# Patient Record
Sex: Female | Born: 1935 | Race: Black or African American | Hispanic: No | Marital: Single | Smoking: Never smoker
Health system: Southern US, Community
[De-identification: ages and names within clinical notes are randomized; demographics above are authoritative.]

## PROBLEM LIST (undated history)

## (undated) DIAGNOSIS — F0390 Unspecified dementia without behavioral disturbance: Secondary | ICD-10-CM

## (undated) DIAGNOSIS — Z960 Presence of urogenital implants: Secondary | ICD-10-CM

## (undated) DIAGNOSIS — E119 Type 2 diabetes mellitus without complications: Secondary | ICD-10-CM

## (undated) DIAGNOSIS — R413 Other amnesia: Secondary | ICD-10-CM

## (undated) DIAGNOSIS — K08109 Complete loss of teeth, unspecified cause, unspecified class: Secondary | ICD-10-CM

## (undated) DIAGNOSIS — E785 Hyperlipidemia, unspecified: Secondary | ICD-10-CM

## (undated) DIAGNOSIS — M5136 Other intervertebral disc degeneration, lumbar region: Secondary | ICD-10-CM

## (undated) DIAGNOSIS — M8580 Other specified disorders of bone density and structure, unspecified site: Secondary | ICD-10-CM

## (undated) DIAGNOSIS — I10 Essential (primary) hypertension: Secondary | ICD-10-CM

## (undated) DIAGNOSIS — Z972 Presence of dental prosthetic device (complete) (partial): Secondary | ICD-10-CM

## (undated) DIAGNOSIS — M51369 Other intervertebral disc degeneration, lumbar region without mention of lumbar back pain or lower extremity pain: Secondary | ICD-10-CM

## (undated) DIAGNOSIS — K5730 Diverticulosis of large intestine without perforation or abscess without bleeding: Secondary | ICD-10-CM

## (undated) DIAGNOSIS — E538 Deficiency of other specified B group vitamins: Secondary | ICD-10-CM

## (undated) DIAGNOSIS — N200 Calculus of kidney: Secondary | ICD-10-CM

## (undated) HISTORY — DX: Deficiency of other specified B group vitamins: E53.8

## (undated) HISTORY — DX: Other specified disorders of bone density and structure, unspecified site: M85.80

## (undated) HISTORY — DX: Other intervertebral disc degeneration, lumbar region: M51.36

## (undated) HISTORY — PX: OVARIAN CYST SURGERY: SHX726

## (undated) HISTORY — DX: Unspecified dementia, unspecified severity, without behavioral disturbance, psychotic disturbance, mood disturbance, and anxiety: F03.90

## (undated) HISTORY — PX: TUBAL LIGATION: SHX77

## (undated) HISTORY — DX: Other intervertebral disc degeneration, lumbar region without mention of lumbar back pain or lower extremity pain: M51.369

---

## 1960-03-22 HISTORY — PX: APPENDECTOMY: SHX54

## 1968-03-22 HISTORY — PX: TONSILLECTOMY: SUR1361

## 1998-01-09 ENCOUNTER — Emergency Department (HOSPITAL_COMMUNITY): Admission: EM | Admit: 1998-01-09 | Discharge: 1998-01-10 | Payer: Self-pay

## 1998-03-22 HISTORY — PX: CATARACT EXTRACTION W/ INTRAOCULAR LENS  IMPLANT, BILATERAL: SHX1307

## 1999-05-29 ENCOUNTER — Encounter: Payer: Self-pay | Admitting: Internal Medicine

## 1999-05-29 ENCOUNTER — Encounter: Payer: Self-pay | Admitting: Emergency Medicine

## 1999-05-29 ENCOUNTER — Inpatient Hospital Stay (HOSPITAL_COMMUNITY): Admission: EM | Admit: 1999-05-29 | Discharge: 1999-05-29 | Payer: Self-pay | Admitting: Emergency Medicine

## 1999-06-09 ENCOUNTER — Encounter: Payer: Self-pay | Admitting: Urology

## 1999-06-09 ENCOUNTER — Inpatient Hospital Stay (HOSPITAL_COMMUNITY): Admission: EM | Admit: 1999-06-09 | Discharge: 1999-06-12 | Payer: Self-pay | Admitting: Emergency Medicine

## 1999-06-09 ENCOUNTER — Encounter: Payer: Self-pay | Admitting: Emergency Medicine

## 1999-06-10 ENCOUNTER — Encounter: Payer: Self-pay | Admitting: Urology

## 1999-06-10 HISTORY — PX: OTHER SURGICAL HISTORY: SHX169

## 1999-09-16 ENCOUNTER — Other Ambulatory Visit: Admission: RE | Admit: 1999-09-16 | Discharge: 1999-09-16 | Payer: Self-pay | Admitting: *Deleted

## 1999-11-02 ENCOUNTER — Encounter (INDEPENDENT_AMBULATORY_CARE_PROVIDER_SITE_OTHER): Payer: Self-pay | Admitting: Specialist

## 1999-11-02 ENCOUNTER — Inpatient Hospital Stay (HOSPITAL_COMMUNITY): Admission: RE | Admit: 1999-11-02 | Discharge: 1999-11-06 | Payer: Self-pay | Admitting: Obstetrics & Gynecology

## 1999-11-02 HISTORY — PX: OTHER SURGICAL HISTORY: SHX169

## 2002-03-22 HISTORY — PX: CHOLECYSTECTOMY: SHX55

## 2002-03-24 ENCOUNTER — Encounter: Payer: Self-pay | Admitting: Emergency Medicine

## 2002-03-24 ENCOUNTER — Emergency Department (HOSPITAL_COMMUNITY): Admission: EM | Admit: 2002-03-24 | Discharge: 2002-03-24 | Payer: Self-pay | Admitting: Emergency Medicine

## 2002-09-17 ENCOUNTER — Encounter: Payer: Self-pay | Admitting: Internal Medicine

## 2002-09-17 ENCOUNTER — Ambulatory Visit (HOSPITAL_COMMUNITY): Admission: RE | Admit: 2002-09-17 | Discharge: 2002-09-17 | Payer: Self-pay | Admitting: Internal Medicine

## 2004-01-03 ENCOUNTER — Emergency Department (HOSPITAL_COMMUNITY): Admission: EM | Admit: 2004-01-03 | Discharge: 2004-01-03 | Payer: Self-pay | Admitting: Emergency Medicine

## 2008-01-01 ENCOUNTER — Emergency Department (HOSPITAL_COMMUNITY): Admission: EM | Admit: 2008-01-01 | Discharge: 2008-01-01 | Payer: Self-pay | Admitting: Emergency Medicine

## 2010-08-07 NOTE — Op Note (Signed)
[facility]  Patient:    [name], [name]                MRN: [id number] Proc. Date: 11/02/99 Adm. Date:  [id number] Attending:  Genia Del                           Operative Report  PREOPERATIVE DIAGNOSIS:  Symptomatic complete uterine prolapses with cystocele grade 4/4 complicated by secondary left ureter dilatation and hydronephrosis (left stent in place) and vaginal polyps.  POSTOPERATIVE DIAGNOSIS:  Symptomatic complete uterine prolpases with cystocele grade 4/4 complicated by secondary left ureter dilatation and hydronephrosis (left stent in place) and vaginal polyps, status post left salpingo-oophorectomy and status post appendectomy.  OPERATION PERFORMED:  Total abdominal hysterectomy, right salpingo-oophorectomy plus Moschcowitzs procedure, colpocystopexy and Burch procedure post vaginal polypectomy.  SURGEON:  Genia Del, M.D.  ASSISTANT:  Pershing Cox, M.D.  ANESTHESIA:  General.  DESCRIPTION OF PROCEDURE:  Under general anesthesia with endotracheal intubation, the patient in lithotomy position, she is prepped with Betadine in the abdomen, suprapubic, vulva and vaginal areas, and draped as usual.  A bladder catheter is put in place.  The gynecologic exam under general anesthesia revealed a complete uterine prolapse with cystocele 4/4 and vaginal polyps.  No adnexal mass is palpable.  The uterine volume is normal.  A median incision is done with the scarlpel.  Excision of the old sca is accomplished. We opened in the midline the aponeurosis and then opened longitudinally the peritoneum with the Metzenbaum scissors.  Exploration of the abdominal cavity revealed a normal liver and normal kidneys, normal bowel.  No appendix.  The pelvic exam reveals a small uterus with normal right ovary and tube, absence of left ovary and tube.  Both sides of the uterus are grasped with Kelly clamps.  We then ligated the round  ligaments on each side with 0 Vicryl.  We cut with the electrocautery.  We then opened the anterior visceral peritoneum and then retracted the bladder downward.  The ureters were well identified on both sides.  The stent is palpable on the left side.  They are away from the surgical field in normal anatomic position.  We then opened a window in the broad ligament on the right side, clamped the infundibulopelvic ligament with the Heaney clamps.  We cut with Mayos and sutured with a free tie and a stitch of 0 Vicryl.  We then preceded along each site of the uterus, skeletonizing the uterine arteries with Metzenbaum scissors.  We then clamped with Heaney clamps on each side, grasped with Mayos and sutured with 0 Vicryl.  We doubled that suture on the uterine arteries on each side.  We then went along the uterus, clamping the cardinal ligaments with the straight Heaney, cutting with the scalpel and suturing with 0 Vicryl with a Heaney suture on each side.  We then reached the angle of the vagina.  We used a curved Heaney on each side.  We clamped and cut with Mayo scissors and sutured with a Heaney suture with 0 Vicryl on each side.  We then preceded with the hysterectomy by cutting the vaginal cuff along the cervix with Mayo scissors. The uterus is given in one piece with the cervix and the right adnexa.  It is sent to pathology.  We then finished the closure of the vaginal vault with 0 Vicryl suture.  Hemostasis is good.  We irrigated the vaginal mucosa  anteriorly and posteriorly to allow placement of the Mersilene mesh. Hemostasis was verified also at the level of the round ligaments and on the right infundibulopelvic ligament, and is adequate.  We therefore preceded with placement of the Mersilene mesh on the vaginal vault.  It is secured with Ethibond 0 anteriorly with three sutures and posteriorly with three sutures. We then opened the peritoneum over the left aspect of the sacrum.   The promontory is identified and at the level of S2 and S3, the sacrum is cleaned as much as possible to the bone.  A very mild venous bleeder is present at that level which is then controlled by compression.  A suture with Ethibond 0 is placed and S2 and then S3 on the perineum.  We then evaluated the size of Mersilene mesh needed by putting a second glove on the right hand and evaluating the tension on the vagina.  The mesh is cut at the level where good suspension of the vagina is reached and yet no tension is present.  We then fixed the two sacral sutures to the Mersilene mesh and attached them.  We then applied compression and reached excellent hemostasis.  Vaginal polypectomies x 2 are done by Dr. Carey Bullocks.  We then proceeded with the Burch procedure opening the vitreous space and reaching the fascia on each side of the urethra.  We pulled on the bladder catheter to make sure that the balloon is at the level of the neck of the bladder.  We inserted a medial suture first on the right side taking the fascia and then attaching it to Coopers ligament ipsilaterally.  A second suture more lateral is then placed and also attached to the ipsilateral Coopers ligament.  We proceeded exactly the same on the left side where hemostasis is completed by compression and Gelfoam.  We then removed all the abdominal laps and the retractor that was put in place at the beginning of the surgery.  We then closed the median incision by two half running sutured of absorbable 0.  We then completed hemostasis with electrocautery at the adipose tissue and closed the skin with staples.  A dry dressing is then applied after infiltrating the subcutaneous tissue with 0.50% Marcaine 10 cc.  The estimated blood loss was 200 cc.  Urine was clear.  No complications occurred and the patient was transferred to the recovery room in good condition.  The instrument counts and compresses were exact and complete x 2. DD:   11/02/99 TD:  11/03/99 Job: [id number]

## 2010-08-07 NOTE — Discharge Summary (Signed)
[place]  Patient:    [name], [name]                        MRN: [id number] Adm. Date:  [id number] Disc. Date: [id number] Attending:  Nelma Rothman Iii                           Discharge Summary  DIAGNOSIS: Left chronic hydronephrosis with urinary tract infection and pain.  OPERATION/PROCEDURE:  1. Cystoscopy on June 10, 1999.  2. Left retrograde ureteral pyelogram and left stent placed on June 10, 1999.  HISTORY OF PRESENT ILLNESS: Ms. [name] is a 77 year old black female who presented with left flank pain and nausea and vomiting.  She had known left hydronephrosis and diabetes which has progressively increased since CAT scan on May 29, 1999.  She was admitted via the emergency room for pain control and stent.  PAST MEDICAL HISTORY, SOCIAL HISTORY, FAMILY HISTORY, REVIEW OF SYSTEMS: Please see the History and Physical for full details.  PHYSICAL EXAMINATION:  VITAL SIGNS: Temperature 99.1 degrees Fahrenheit, pulse 72, respirations 20, blood pressure 148/80.  GENERAL: She is well-developed, well-nourished and in mild distress, oriented x 3.  HEENT: Normal.  NECK: Without masses or thyromegaly.  CHEST: Clear anterior and posterior without rales or rhonchi.  BREAST: Normal, no adenopathy present.  HEART: Normal sinus rhythm without murmurs, rubs, or gallops.  ABDOMEN: Mild left upper quadrant and left CVA tenderness noted to palpation but no masses noted.  GU: Otherwise normal.  EXTREMITIES: Normal.  SKIN: Intact.  HOSPITAL COURSE: The patient was admitted and placed on appropriate IV antibiotics along with sliding-scale insulin, consisting of Cipro IV, and was subsequently taken to surgery on June 10, 1999 by Dr. Larey Dresser.  She underwent a left retrograde ureteral pyelogram with left JJ stent placed.  She was noted to have what was thought to be a cystocele causing hydronephrosis. By June 11, 1999 she was  afebrile and CBGs were okay.  The morphine PCA was discontinued and diet was advanced.  She subsequently did well and was discharged on June 12, 1999 feeling much better.  She was discharged home with the stent.  DISCHARGE MEDICATIONS: Cipro.  FOLLOW-UP: She was to follow up with Dr. Vernie Ammons the following week. Discharge instructions were given.  DISCHARGE CONDITION: Improved.DD:  07/21/99 TD:  07/23/99 Job: [id number]

## 2010-08-07 NOTE — Discharge Summary (Signed)
[place]  Patient:    [name], [name]                        MRN: [id number] Adm. Date:  [id number] Disc. Date: [id number] Attending:  Nelma Rothman Iii                           Discharge Summary  FINAL DIAGNOSIS:  Acute hydronephrosis and hydroureter due to possible stone disease.  HISTORY OF PRESENT ILLNESS:  This 77 year old black female was initially evaluated by Dr. Kimberlee Nearing in my absence (report [phone]).  She was having significant abdominal pain in the left lower quadrant associated with nausea and vomiting and x-rays demonstrated a left hydronephrosis and left hydroureter.  It was felt that she needed to be admitted and further evaluated by a urologist.  HOSPITAL COURSE:  She was evaluated by the urologist, Dr. Vernie Ammons, who felt that she probably had chronic left hydronephrosis and was not clear if this was the cause of her pain.  She did have 11-20 WBCs but no RBCs in her urine.   The CT scan of her abdomen is described as follows:  1. Right kidney and bladder were normal. 2. Left kidney showed hydronephrosis with contrast seen in the collecting    system, thick-walled ureter seen down to the ureteral vesicle junction with    no stone evident.  He felt that since her pain was resolved and her    vomiting had resolved that she could be discharged, and followed as an    outpatient to get IVP, or other studies.  She was thus considered to be at maximum hospital benefit and was not retained in the facility.  LABORATORY DATA:  Urine report as described above.  EKG showed normal sinus rhythm with a rate of 76 beats per minute.  Blood gases showed a pH of 7.472, PCO2 36, and bicarbonate of 26.  CBC shows a white count 6,300, hemoglobin 12, hematocrit 34%, later dropping to 32%.  Her chemistry shows a sodium 145, potassium 3.0, chloride 107, glucose 104, BUN of 8, on one of the specimens.  Her liver functions were normal.   Her amylase was slightly elevated at 136.  Her CK-MBs were negative.  Troponin I was less than 0.03.  Her urine culture grew 70,000 colonies per mL of Klebsiella pneumoniae resistant to ampicillin and carbenicillin but sensitive to all the other usual antibiotics tested for this class of compounds.  ______:  None ______.  TRANSFUSIONS:  None.  RECOMMENDATIONS ON DISCHARGE:  The patient was advised to return home and continue her same medications.  She should followup with Dr. Vernie Ammons in his office.  DIET:  Follow a regular diet.  ACTIVITY:  As tolerated.  Report if any further nausea and vomiting or abdominal pain.  CONDITION ON DISCHARGE:  Improved. DD:  09/12/99 TD:  09/14/99 Job: [id number]

## 2010-08-07 NOTE — H&P (Signed)
[facility]  Patient:    [name], [name]                          MRN: [id number] Adm. Date:  11/02/99 Attending:  Genia Del, M.D.                         History and Physical  DATE OF BIRTH:  03-04-1930  CHIEF COMPLAINT:  [name] is a 77 year old  G3, P3 whose reason for admission is symptomatic complete uterine prolapse with cystocele grade 4/4.  HISTORY OF PRESENT ILLNESS:  She was first evaluated at our office on September 16, 1999 for grade 4/4 cystocele. She was found to have complete uterine prolapse at that time with grade 4/4 cystocele. The patient had presented to the emergency room in March 2001 and was found to have left ureteral dilatation and obstruction with secondary hydronephrosis probably because of the grade 4/4 cystocele. A stent was put in place by Dr. Vernie Ammons at that time. She had discomfort due to the vulvar mass, had occasional stress urinary incontinence, no problems with bowels, no other symptoms. She is not on hormone replacement therapy and did not have any abnormal uterine bleeding. At that visit, she had pessary inserted by Dr. Carey Bullocks to control symptoms until surgery. She was also started on Estrace, Premarin, and vaginal cream until surgery. The patient was sent to me by Dr. Carey Bullocks because of my experience with colposacropexy. I evaluated her on September 15, 1999 and found a complete uterine prolapse with grade 4/4 cystocele and vaginal polyps which were also seen by Dr. Carey Bullocks previously. She was seen again for pessary check and discussion of management on July 5 and on October 15, 1999.  PAST MEDICAL HISTORY:  Positive for pyelonephritis, chronic hypertension, type 2 diabetes mellitus and hypercholesterolemia.  PAST SURGICAL HISTORY:  Positive for cataracts, P&A and ovarian cystectomy with appendectomy in 1962 as well as bilateral tubal ligation. She had left ureteral stent put in place March 2001.  CURRENT  MEDICATIONS:  Accupril 20 mg q.d., Lasix 20 mg 3-4 times a week, Zocor 40 mg q.d. and glucophage 150 mg q.d.  ALLERGIES:  SULFA and CEPHALEXIN with angioedema, intolerance to CODEINE with vomiting.  SOCIAL HISTORY:  The patient is a nonsmoker. She is separated, 3 sons live with her. She is retired from [employer].  REVIEW OF SYSTEMS:  CONSTITUTIONAL:  Negative except for 50 pound loss since 1999. HEENT:  Negative. RESPIRATORY:  Negative.  CARDIOVASCULAR:  Negative. GASTROINTESTINAL:  Heartburn. GENITOURINARY:  See HPI. MUSCULOSKELETAL: Negative. DERMATO:  Negative. NEURO:  Negative. PSYCHIATRIC:  Negative. ENDOCRINE: Diabetes mellitus. HEMATO/LYMPHO:  Negative.  PHYSICAL EXAMINATION:  GENERAL:  No apparent distress.  VITAL SIGNS:  Blood pressure 130/80, pulse 76, weight 126.  GENERAL:  Normal.  HEENT:  Within normal limits.  LUNGS:  Clear bilaterally. No adventitious sounds.  HEART:  S1, S2 normal. No S3, S4, no murmur.  ABDOMEN:  Soft, nontender. No mass, no hernia, no hepatosplenomegaly. Midline incision is present with a bad scar.  LYMPH NODES:  All negative.  SKIN:  Normal to inspection and palpation.  BREASTS:  Normal bilaterally. No nodule palpable. CVA tenderness negative.  PELVIC:  Vulva normal. Vagina, polyps present. Uterine prolapse is complete. Uterus normal volume. No lesion on cervix. Cystocele grade 4/4. No adnexal mass palpable.  RECTAL:  Negative with negative Hemoccult.  LABORATORY DATA:  Preop  labs showed nitrites in urine, therefore, the patient was started on Macrobid 1 tablet p.o. b.i.d. since August 12.  IMPRESSION:  A 77 year old G3, P3 with symptomatic uterine prolapse and cystocele grade 4/4 complicated by left ureteral obstruction for which a stent was required. Vaginal polyps.  PLAN:  Admit to [facility]. Decision made to proceed with surgical correction by total abdominal hysterectomy, bilateral  salpingo-oophorectomy and colposacropexy with Moschowitz procedure p.r.n. and Burch procedure vaginal polypectomies will be done. The surgery risk and benefits were reviewed with the patient. Informed consent was obtained. Dr. Gildardo Griffes will assist.  DD:  11/02/99 TD:  11/02/99 Job: [id number]

## 2010-08-07 NOTE — H&P (Signed)
[facility]  Patient:    [name], [name]                          MRN: [id number] Adm. Date:  11/02/99 Attending:  Genia Del, M.D.                         History and Physical  [name] is a 77 year old G3, P3.  REASON FOR ADMISSION:  Complete uterine prolapse with grade 4/4 cystocele.  HISTORY OF PRESENT ILLNESS:  [name] was referred to our practice and evaluated first on 08/21/99 .  She had gone to the emergency room in March and had a Stent because of ureteral obstruction, probably due to her cystocele. Dr. Carey Bullocks saw her on 09/16/99 and a pessary was placed to help her with the symptoms until surgery was achieved.  She then was referred to me by Dr. Carey Bullocks to evaluate the indication of colposacropexy .  I saw her on 09/17/99, 09/24/99 and 10/14/99.  She was doing well with the pessary but preferred surgical correction.  Very rarely, stress incontinence, mass in the vagina and at the vulva with standing and very occasionally has some spotting vaginally while exercising.  No problems with bowel movements.  PAST MEDICAL HISTORY: 1. Pyelonephritis. 2. Chronic hypertension. 3. Type 2 diabetes mellitus. 4. Elevated cholesterol.  PAST SURGICAL HISTORY: 1. Cystectomy in 1962. 2. Bilateral tubal ligation. 3. T & A. 4. Cataract. 5. Left ureteral stent.  MEDICATIONS: 1. Accupril 20 mg q.d. 2. Lasix 20 three or four times a week. 3. Zocor 40 mg. h.s. 4. Glucophage 500 mg. q.d., not taken X 3 months.  ALLERGIES:  Intolerance to CODEINE and ALLERGY TO SULFA AND CEPHALEXIN with angioedema.  FAMILY HISTORY:  No significant family history except for hypertension and stroke and type 2 diabetes mellitus.  SOCIAL HISTORY:  Nonsmoker.  Separated, 3 sons living with her. Retired from [employer].  REVIEW OF SYSTEMS: Constitutional:  Negative. HEENT:  Within normal limits. Respiratory:  Negative.  Cardiovascular:   Negative. GASTROINTESTINAL: Negative. Dermatologic:  Negative  NEUROLOGIC:  Negative. ENDOCRINE:  Diabetes.  Lympho:  Negative.  PHYSICAL EXAMINATION:  No acute distress. Blood pressure 130/80, pulse regular.   HEENT:  Normal.  HEART:  S1, S2 normal.  Regular rate and rhythm.  No murmurs.  LUNGS:  Clear bilaterally.  No adventitious sounds.  ABDOMEN:  Soft, nontender, nondistended.  No mass palpable.  No hernias.  No hepatosplenomegaly.  Lymph nodes in the neck, axilla, and groin area are negative.  SKIN:  Normal to palpable inspection.  BREASTS:  Normal bilaterally.  No nodules palpable.  No CVA tenderness bilaterally.  GYNECOLOGIC:  Vulva normal.  Uterus, cervix, and vagina showing complete uterine prolapse with grade 4/4 cystocele.  No ulceration.  Uterus normal volume.  Cervix normal volume.  A Pap test was done in June, 2001, showing ascus.  RECTAL:  Normal, with negative guaiac.  Lower Limbs:   Normal with good pulses bilaterally.  Pap test came back with ascus.  Pessary was inserted until the time of surgery.  IMPRESSION: 1. Symptomatic, complete uterine prolapse, with grade 4/4 cystocele with probable left ureteral obstruction for which a stent is in place. 2. Well-controlled type 2 diabetes mellitus. 3. Well-controlled chronic hypertension.  PLAN:  Decision made to proceed with TAH/BSO with colposacropexy and Birch procedure. Surgery risks and benefits were thoroughly discussed with patient including bleeding,  infection, trauma, and complications of mesh, with informed consent obtained.  The procedure will be done at [facility] on November 02, 1999, with Dr. Gildardo Griffes as my assistant. DD:  11/02/99 TD:  11/02/99 Job: [id number]

## 2010-08-07 NOTE — Discharge Summary (Signed)
[facility]  Patient:    [name], [name]                MRN: [id number] Adm. Date:  [id number] Disc. Date: [id number] Attending:  Genia Del                           Discharge Summary  DATE OF BIRTH:  05-13-1929  ADMISSION DIAGNOSES: 1. Symptomatic complete uterine prolapses with cystocele, grade 4/4, with    probable secondary to left ureteral patient and hydronephrosis. 2. Left ureteral stent in place. 3. Vaginal polyps.  DISCHARGE DIAGNOSES: 1. Symptomatic complete uterine prolapses with cystocele, grade 4/4, with    probable secondary left ureteral patient and hydronephrosis. 2. Left ureteral stent in place. 3. Vaginal polyps.  HOSPITAL COURSE:  [name] is a 77 year old black female, G3, P3, status post bilateral tubal sterilization, who presented with symptomatic complete uterine prolapse and cystocele grade 4/4 with hydronephrosis for which a left ureteral stent was put in place by urology.  She opted for surgical approach and had operation on November 02, 1999.  She was status post left salpingo-oophorectomy and appendectomy.  She had VAH-RSO with Moschcowitz procedure and colposacropexy as well as Burch procedure and vaginal polypectomy.  The surgery went uneventfully.  The colposacropexy was done with a Mersilene mesh.  She had an estimated blood loss of 200 cc.  Dr. Gildardo Griffes was assisting for the surgery.  On postop day #1, her hemoglobin was 9.  The preop hemoglobin was 10.6.  She was on Macrobid for possible cystitis diagnosed preop.  Her vital signs were normal, no fever, and she was tolerating her diet without problem.  On postop day #4, we were successful in weaning her bladder catheter, and she was sent home in good status.  DISCHARGE MEDICATIONS: Darvocet-N 100 was prescribed p.r.n. as well as Slow Fe b.i.d. and Colace p.r.n.  FOLLOWUP: She will be followed up in the office at [facility] in  two to three weeks. DD:  11/18/99 TD:  11/19/99 Job: [id number]

## 2010-08-07 NOTE — Op Note (Signed)
[place]. [facility]  Patient:    [name], [name]                        MRN: [id number] Proc. Date: 06/10/99 Adm. Date:  [id number] Attending:  Nelma Rothman Iii CC:         Janae Bridgeman. Eloise Harman., M.D.                           Operative Report  PREOPERATIVE DIAGNOSES: 1. Left flank pain. 2. Left hydronephrosis of uncertain etiology.  POSTOPERATIVE DIAGNOSES: 1. Left flank pain. 2. Left hydronephrosis possibly due to severe cystocele.  OPERATION:  Cystoscopy, left retrograde pyelogram with interpretation, insertion of left double-J catheter.  SURGEON:  Maretta Bees. Vonita Moss, M.D.  ANESTHESIA:  General.  INDICATION:  This 77 year old black female was admitted with severe recurrent left flank pain.  She has developed progressive left hydronephrosis that was previously known but has become worse and more symptomatic.  She was admitted for pain relief and drainage of this kidney.  DESCRIPTION OF PROCEDURE:  On examination, after prepping the patient, she had  third degree cystocele.  She had an epithelial cyst on the mucosa.  She was cystoscoped and the bladder had no stones, tumors, or inflammatory lesions but obviously was distorted by the significant cystocele.  Had to push up in the vaginal canal to see the orifices.  I inserted a guide wire up the left ureteral orifice and then an open-ended ureteral catheter through which I injected contrast and demonstrated a moderate  hydronephrosis.  The guide wire was reinserted and a 6 French 26 cm double-J catheter was inserted without string.  It coiled nicely in the renal pelvis with a full coil in the bladder.  This was demonstrated radiographically.  The bladder was then emptied, the scope removed, and the patient sent to the recovery room in good condition. DD:  06/10/99 TD:  06/11/99 Job: [id number]

## 2010-08-07 NOTE — H&P (Signed)
[place]. [facility]  Patient:    [name], [name]                        MRN: [id number] Adm. Date:  [id number] Attending:  Vashti Hey CC:         Samul Dada, M.D.             Janae Bridgeman. Eloise Harman., M.D.                         History and Physical  DATE OF BIRTH:  [birth year]  HISTORY OF PRESENT ILLNESS:  [name] is a 77 year old black female who was brought to the [facility] Emergency Room last night by her son for severe left lower quadrant abdominal pain followed by nausea and vomiting which occurred at  about 5 to 6 p.m. on May 28, 1999.  The patient had marked tenderness on exam n the emergency room, and CT scan of the abdomen and pelvis was order by Dr. Quentin Ore.  She apparently had rectal contrast.  The exam showed a left hydronephrosis and left hydroureter.  The formal report is still pending.  The patient denies ny history of stones, and none were seen on the CT.  No history of hematuria, urine or bowel symptoms.  She has had urinary tract infections in the past, most recently December of last year.  She states that she has had some nagging intermittent left lower quadrant pain for the past month or so which did not require medical attention or treatment.  She has had no prior similar episodes to what has brought her to the hospital on this occasion.  In general, her health has been good with good appetite and energy, no sense of ill health.  She states that she has lost  about 20 pounds over the past couple of years.  This appears to have been volitional due to her diabetes.  She denies any fever or chills.  The patient currently is comfortable without nausea or vomiting since coming to the hospital. She denies any pain at present.  PAST MEDICAL HISTORY:  She has had hypertension for 15 years, non-insulin-dependent diabetes mellitus since 1994, high cholesterol over the past couple of  years. he has had surgery in 1962 for a cyst on her ovary which was removed.  She had an appendectomy at that time.  She had a tubal ligation in 1970, also cataract surgery on each eye by Dr. Dione Booze in July and September of last year.  She denies any use of cigarettes or alcohol.  No history of tobacco products.  No history of blood transfusions.  ALLERGIES:  SULFA which causes swelling of the face and a rash.  CURRENT MEDICATIONS: 1. Lasix 20 mg daily Monday through Friday. 2. Accupril 10 mg q.d. 3. Glucophage XR 500 mg q.d. 4. Zocor 40 mg q.d.  FAMILY HISTORY:  Mother died at age 53 with hypertension, diabetes, and heart problems.  Father died in his 31s, had hypertension.  A brother and a sister have hypertension.  There are three sons, ages 1, 47, and 56, who are in good health.  SOCIAL HISTORY:  The patient was born and raised in [place].  She is single, lives with her three sons who are not married.  She drives, is active. She retired in 1999.  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  REVIEW OF SYSTEMS:  She denies any symptoms related to neurologic or cardiopulmonary systems.  She has had hiatal hernia, occasionally takes Maalox.  She occasionally has some swelling of her legs.  She has prolapsed uterus which has not required any medical attention.  She denies any vaginal bleeding.  She has never had a colonoscopy.  She has not had a recent mammogram and does not have regular exams.  PHYSICAL EXAMINATION:  GENERAL:  She is in no acute distress.  She appears well-developed, well-nourished, looking about her stated age, perhaps a little younger.  VITAL SIGNS:  Pulse is 60 and regular, respiratory rate 16 and unlabored, blood  pressure 115/65; temperature currently 98.6, although at 2100 hours, her temperature was recorded at 100.7.  She gives her weight at 155 pounds, height 5 feet 6  inches.  HEENT:  Atraumatic.  No scleral icterus.  Pupillary and extraocular movements appear in tact.  She is edentulous.  Mouth and pharynx are benign.  NECK:  Without adenopathy, thyroid enlargement, or bruits.  HEART:  Normal.  LUNGS:  Normal.  BREASTS:  Without masses.  ADENOPATHY:  No axillary or inguinal adenopathy.  BACK:  No back or CVA tenderness.  ABDOMEN:  She has midline subumbilical surgical scars, well healed.  Abdomen is  generally soft, doughy, nontender, with no organomegaly or masses palpable. Bowel sounds are normal.  EXTREMITIES:  No peripheral edema, clubbing, or palmar erythema.  NEUROLOGIC:  Grossly normal.  PELVIC/RECTAL:  Exams not carried out at this time.  LABORATORY DATA:  Hemoglobin 11.1, hematocrit 31.9, white count 6.3 with 60% polys, platelets 262,000.  MCV was slightly low at 76.2.  Amylase was slightly high at 136 with normal being up to 131.  Lipase was normal at 34.  CK and other cardiac parameters were normal as was her EKG which showed normal sinus rhythm. Urinalysis showed greater than 300 mg percent of protein; no glucose, red cells, white cells, or bacteria.  Basic metabolic panel showed potassium of 3.0, BUN 8, creatinine .8.  CT scan of the abdomen and pelvis shows left hydronephrosis with enlarged kidney and ureter.  IMPRESSION AND PLAN: 1. Left lower quadrant pain, nausea and vomiting.  Rule out ureteral stone,    fibrosis, or tumor. 2. Left hydronephrosis. 3. Hypokalemia. 4. Non-insulin-dependent diabetes mellitus. 5. Hypertension. 6. Hypercholesterolemia. 7. Mild anemia with low MCV.  Need to rule out iron deficiency and check stools.  The patient will be admitted to the hospital under Dr. Bayard Beaver service.  She will need a urology consult to work up her left hydronephrosis which would appear to be the etiology of her pain and nausea, vomiting.  This sounds as though it could be a ureteral stone.  She will  probably need a retrograde, maybe a stent.  She will need replacement of potassium and investigations of her anemia to rule out  iron deficiency and blood in her stools.  For health maintenance, she should have mammograms yearly which she has been advised to do and perhaps a colonoscopy as  baseline. DD:  05/29/99 TD:  05/29/99 Job: [id number]

## 2010-08-07 NOTE — Consult Note (Signed)
[place]. [facility]  Patient:    [name], [name]                        MRN: [id number] Adm. Date:  [id number] Attending:  Vashti Hey                          Consultation Report  HISTORY OF PRESENT ILLNESS:  This 77 year old black female is seen in consultation for further evaluation of acute onset of left lower quadrant pain that began last evening.  It was described as sharp and quite severe, but it was slow onset and it had previously occurred in a milder form earlier in the month.  She described some of the pain as "coming in waves."  There was associated nausea and vomiting. She had no hematuria or irritated voiding symptoms associated with this.  The pain id not radiate up into the flank or down into the genitalia.  She has no history of stones.  The pain has since resolved, and there is no family history of kidney stones or renal disease.   She denies any bowel abnormalities or changes in her  bowel habits.  She has had an approximately 20 pound weight loss, but she has been attempting that over the past two years.  She denied any fever or chills.  PAST MEDICAL HISTORY:  Hypertension for 15 years, non-insulin-dependent diabetes since 1994, hypercholesterolemia.  SURGICAL HISTORY:  She has had a cyst on her ovary removed, an appendectomy in 1962, a tonsillectomy in 1970, and cataract surgery.  MEDICATIONS:  Lasix 20 mg a day, Accupril 10 mg a day, Glucophage XR 150 mg a day, and Zocor 40 mg a day.  ALLERGIES:  SULFA caused swelling of her face and a rash.  FAMILY HISTORY:  Mother died at age 57, had hypertension, diabetes.  Father died in his 70s and had hypertension.  There is no family history of renal disease or GU malignancy.  SOCIAL HISTORY:  She lives with her son, drives, and is retired.  REVIEW OF SYSTEMS:  Occasional gastroesophageal reflux symptoms, swelling of her lower extremities.  Prolapsed uterus with  no bleeding.  PHYSICAL EXAMINATION:  VITAL SIGNS:  Blood pressure 115/65, temperature 98.6, respirations 16, pulse 60.  GENERAL:  The patient is a well-developed, well-nourished black female in no apparent distress.  HEENT:  Atraumatic, normocephalic.  Oropharynx is clear.  Sclerae are nonicteric.  NECK:  Supple without thyromegaly or bruit.  CARDIOVASCULAR:  Regular rate and rhythm without murmurs, rubs, clicks, or gallops.  LUNGS:  Clear to auscultation bilaterally.  ABDOMEN:  Soft, midline scar is noted, nontender, and no hepatosplenomegaly is noted.  She has no peritoneal signs.  She had no CVA tenderness to palpation or  brisk percussion.  GENITOURINARY:  Normal external female genitalia.  EXTREMITIES:  Without clubbing, cyanosis, or edema.  NEUROLOGIC:  She has no gross focal neurologic deficits.  LABORATORY RESULTS:  Her white count is 6.3, H&H 11.1 and 31.9, platelets 262. Her urinalysis had 10-12 white cells but no red cells, 300 mg/dl protein.  BUN 18, creatinine 0.8.  Review of the CT scan reveals normal right kidney with no hydro or stones and the bladder appears normal, as well.  The left kidney has hydronephrosis with good overlying parenchyma and prompt function of the kidney, with contrast being seen layering in the collecting system.  The ureter is seen throughout its  entire course to be mildly dilated, with an abnormally thick wall throughout its length down o the level of the UVJ.  No obvious mass is noted, extravesically or intravesically, to be causing the obstruction.  No stones are noted.  IMPRESSION:  Chronic left hydronephrosis, as evidenced by the hydronephrosis and hypertrophy of the ureteral wall throughout its course.  The etiology at this point is undetermined.  However, the possibilities include distal ureteral stricture f a benign cause versus old left vesicoureteral reflux versus an extraureteral neoplastic process in the area  of the UVJ.  I do not feel that, due to the chronic appearance of her hydronephrosis, that this is the cause of her pain, although t is possible.  She is pain-free at this time, and this certainly needs further workup.  It can be performed as an outpatient.  My hope is that I can gain a little more information by obtaining a KUB with the contrast still in the collecting system.  PLAN: 1. KUB now to see if the area of obstruction can be delineated. 2. Follow-up as an outpatient for possible IVP versus cystoscopy and retrograde    pyelogram. DD:  05/29/99 TD:  05/29/99 Job: [id number]

## 2012-06-04 ENCOUNTER — Emergency Department (HOSPITAL_COMMUNITY)
Admission: EM | Admit: 2012-06-04 | Discharge: 2012-06-04 | Disposition: A | Discharge status: Home / Self Care | Payer: Medicare HMO | Attending: Emergency Medicine | Admitting: Emergency Medicine

## 2012-06-04 ENCOUNTER — Encounter (HOSPITAL_COMMUNITY): Payer: Self-pay | Admitting: Emergency Medicine

## 2012-06-04 ENCOUNTER — Emergency Department (HOSPITAL_COMMUNITY): Payer: Medicare HMO

## 2012-06-04 DIAGNOSIS — Z79899 Other long term (current) drug therapy: Secondary | ICD-10-CM | POA: Insufficient documentation

## 2012-06-04 DIAGNOSIS — S6990XA Unspecified injury of unspecified wrist, hand and finger(s), initial encounter: Secondary | ICD-10-CM | POA: Insufficient documentation

## 2012-06-04 DIAGNOSIS — Y939 Activity, unspecified: Secondary | ICD-10-CM | POA: Insufficient documentation

## 2012-06-04 DIAGNOSIS — E785 Hyperlipidemia, unspecified: Secondary | ICD-10-CM | POA: Insufficient documentation

## 2012-06-04 DIAGNOSIS — Y92009 Unspecified place in unspecified non-institutional (private) residence as the place of occurrence of the external cause: Secondary | ICD-10-CM | POA: Insufficient documentation

## 2012-06-04 DIAGNOSIS — N390 Urinary tract infection, site not specified: Secondary | ICD-10-CM | POA: Insufficient documentation

## 2012-06-04 DIAGNOSIS — S8990XA Unspecified injury of unspecified lower leg, initial encounter: Secondary | ICD-10-CM | POA: Insufficient documentation

## 2012-06-04 DIAGNOSIS — S59909A Unspecified injury of unspecified elbow, initial encounter: Secondary | ICD-10-CM | POA: Insufficient documentation

## 2012-06-04 DIAGNOSIS — I10 Essential (primary) hypertension: Secondary | ICD-10-CM | POA: Insufficient documentation

## 2012-06-04 DIAGNOSIS — S46909A Unspecified injury of unspecified muscle, fascia and tendon at shoulder and upper arm level, unspecified arm, initial encounter: Secondary | ICD-10-CM | POA: Insufficient documentation

## 2012-06-04 DIAGNOSIS — W010XXA Fall on same level from slipping, tripping and stumbling without subsequent striking against object, initial encounter: Secondary | ICD-10-CM | POA: Insufficient documentation

## 2012-06-04 DIAGNOSIS — E119 Type 2 diabetes mellitus without complications: Secondary | ICD-10-CM | POA: Insufficient documentation

## 2012-06-04 DIAGNOSIS — R42 Dizziness and giddiness: Secondary | ICD-10-CM | POA: Insufficient documentation

## 2012-06-04 DIAGNOSIS — Z9889 Other specified postprocedural states: Secondary | ICD-10-CM | POA: Insufficient documentation

## 2012-06-04 HISTORY — DX: Hyperlipidemia, unspecified: E78.5

## 2012-06-04 HISTORY — DX: Essential (primary) hypertension: I10

## 2012-06-04 LAB — CBC WITH DIFFERENTIAL/PLATELET
Basophils Absolute: 0 10*3/uL (ref 0.0–0.1)
Basophils Relative: 0 % (ref 0–1)
Eosinophils Absolute: 0.1 10*3/uL (ref 0.0–0.7)
Eosinophils Relative: 3 % (ref 0–5)
HCT: 36 % (ref 36.0–46.0)
Hemoglobin: 12.3 g/dL (ref 12.0–15.0)
Lymphocytes Relative: 49 % — ABNORMAL HIGH (ref 12–46)
Lymphs Abs: 1.7 10*3/uL (ref 0.7–4.0)
MCH: 27.3 pg (ref 26.0–34.0)
MCHC: 34.2 g/dL (ref 30.0–36.0)
MCV: 79.8 fL (ref 78.0–100.0)
Monocytes Absolute: 0.2 10*3/uL (ref 0.1–1.0)
Monocytes Relative: 5 % (ref 3–12)
Neutro Abs: 1.5 10*3/uL — ABNORMAL LOW (ref 1.7–7.7)
Neutrophils Relative %: 43 % (ref 43–77)
Platelets: 149 10*3/uL — ABNORMAL LOW (ref 150–400)
RBC: 4.51 MIL/uL (ref 3.87–5.11)
RDW: 13.3 % (ref 11.5–15.5)
WBC: 3.5 10*3/uL — ABNORMAL LOW (ref 4.0–10.5)

## 2012-06-04 LAB — COMPREHENSIVE METABOLIC PANEL
ALT: 9 U/L (ref 0–35)
AST: 18 U/L (ref 0–37)
Albumin: 3.9 g/dL (ref 3.5–5.2)
Alkaline Phosphatase: 68 U/L (ref 39–117)
BUN: 13 mg/dL (ref 6–23)
CO2: 26 mEq/L (ref 19–32)
Calcium: 9.1 mg/dL (ref 8.4–10.5)
Chloride: 104 mEq/L (ref 96–112)
Creatinine, Ser: 0.84 mg/dL (ref 0.50–1.10)
GFR calc Af Amer: 74 mL/min — ABNORMAL LOW (ref >90)
GFR calc non Af Amer: 64 mL/min — ABNORMAL LOW (ref >90)
Glucose, Bld: 101 mg/dL — ABNORMAL HIGH (ref 70–99)
Potassium: 3.8 mEq/L (ref 3.5–5.1)
Sodium: 138 mEq/L (ref 135–145)
Total Bilirubin: 0.4 mg/dL (ref 0.3–1.2)
Total Protein: 7.4 g/dL (ref 6.0–8.3)

## 2012-06-04 LAB — URINE MICROSCOPIC-ADD ON

## 2012-06-04 LAB — URINALYSIS, ROUTINE W REFLEX MICROSCOPIC
Glucose, UA: NEGATIVE mg/dL
Ketones, ur: NEGATIVE mg/dL
Protein, ur: 30 mg/dL — AB
Urobilinogen, UA: 1 mg/dL (ref 0.0–1.0)

## 2012-06-04 MED ORDER — CEFUROXIME AXETIL 250 MG PO TABS
250.0000 mg | ORAL_TABLET | Freq: Two times a day (BID) | ORAL | Status: DC
  Administered 2012-06-04: 250 mg via ORAL
  Filled 2012-06-04 (×3): qty 1

## 2012-06-04 MED ORDER — TRAMADOL HCL 50 MG PO TABS
50.0000 mg | ORAL_TABLET | Freq: Once | ORAL | Status: AC
  Administered 2012-06-04: 50 mg via ORAL
  Filled 2012-06-04: qty 1

## 2012-06-04 MED ORDER — CEFUROXIME AXETIL 250 MG PO TABS: 250.0000 mg | ORAL_TABLET | Freq: Two times a day (BID) | ORAL | Status: DC

## 2012-06-04 MED ORDER — TRAMADOL HCL 50 MG PO TABS: 50.0000 mg | ORAL_TABLET | Freq: Four times a day (QID) | ORAL | Status: DC | PRN

## 2012-06-04 NOTE — Discharge Instructions (Signed)
Urinary Tract Infection  A urinary tract infection (UTI) is often caused by a germ (bacteria). A UTI is usually helped with medicine (antibiotics) that kills germs. Take all the medicine until it is gone. Do this even if you are feeling better. You are usually better in 7 to 10 days.  HOME CARE    Drink enough water and fluids to keep your pee (urine) clear or pale yellow. Drink:   Cranberry juice.   Water.   Avoid:   Caffeine.   Tea.   Bubbly (carbonated) drinks.   Alcohol.   Only take medicine as told by your doctor.   To prevent further infections:   Pee often.   After pooping (bowel movement), women should wipe from front to back. Use each tissue only once.   Pee before and after having sex (intercourse).  Ask your doctor when your test results will be ready. Make sure you follow up and get your test results.   GET HELP RIGHT AWAY IF:    There is very bad back pain or lower belly (abdominal) pain.   You get the chills.   You have a fever.   Your baby is older than 3 months with a rectal temperature of 102 F (38.9 C) or higher.   Your baby is 3 months old or younger with a rectal temperature of 100.4 F (38 C) or higher.   You feel sick to your stomach (nauseous) or throw up (vomit).   There is continued burning with peeing.   Your problems are not better in 3 days. Return sooner if you are getting worse.  MAKE SURE YOU:    Understand these instructions.   Will watch your condition.   Will get help right away if you are not doing well or get worse.  Document Released: 08/25/2007 Document Revised: 05/31/2011 Document Reviewed: 08/25/2007  [employer] Patient Information 2013 [employer].

## 2012-06-04 NOTE — ED Notes (Signed)
Pt presenting to ed with c/o falling off the porch. Pt denies loc. Pt states she did have some dizziness but it is gone. Pt denies hitting her head. Pt is alert and oriented at this time. Pt states pain on her right side where she fell.

## 2012-06-04 NOTE — ED Provider Notes (Addendum)
History    CSN: [id number] Arrival date & time 06/04/12  1140 First MD Initiated Contact with Patient 06/04/12 1208     Chief Complaint  Patient presents with  . Fall   HPI Patient presents to emergency room with complaints of pain in her right shoulder. She's also having pain in her knees. Patient was on her porch when she stumbled and fell onto her knees and her right arm. The patient denies any loss of consciousness. She denies any numbness, weakness or dizziness. Family states that she had mentioned some lightheadedness after she felt but the patient is currently denying that.  She is not having chest pain or shortness of breath. The pain is moderate. It increases with movement and palpation. Patient was able to ambulate from her car to the emergency room.  Past Medical History  Diagnosis Date  . Hypertension   . Diabetes mellitus without complication   . Hyperlipidemia     History reviewed. No pertinent past surgical history.  No family history on file.  History  Substance Use Topics  . Smoking status: Never Smoker   . Smokeless tobacco: Not on file  . Alcohol Use: No    OB History   Grav Para Term Preterm Abortions TAB SAB Ect Mult Living                  Review of Systems  All other systems reviewed and are negative.    Allergies  Review of patient's allergies indicates no known allergies.  Home Medications   Current Outpatient Rx  Name  Route  Sig  Dispense  Refill  . acetaminophen (TYLENOL) 500 MG tablet   Oral   Take 500 mg by mouth every 6 (six) hours as needed for pain.         . cyanocobalamin (,VITAMIN B-12,) 1000 MCG/ML injection   Intramuscular   Inject 1,000 mcg into the muscle every 30 (thirty) days.         Marland Kitchen donepezil (ARICEPT) 5 MG tablet   Oral   Take 5 mg by mouth at bedtime.         . furosemide (LASIX) 20 MG tablet   Oral   Take 20 mg by mouth daily.         . rosuvastatin (CRESTOR) 20 MG tablet   Oral   Take 20 mg by  mouth daily.           BP 174/83  Pulse 64  Temp(Src) 98.2 F (36.8 C) (Oral)  Resp 18  SpO2 97%  Physical Exam  Nursing note and vitals reviewed. Constitutional: She appears well-developed and well-nourished. No distress.  HENT:  Head: Normocephalic and atraumatic.  Right Ear: External ear normal.  Left Ear: External ear normal.  Eyes: Conjunctivae are normal. Right eye exhibits no discharge. Left eye exhibits no discharge. No scleral icterus.  Neck: Neck supple. No tracheal deviation present.  Cardiovascular: Normal rate, regular rhythm and intact distal pulses.   Pulmonary/Chest: Effort normal and breath sounds normal. No stridor. No respiratory distress. She has no wheezes. She has no rales.  Abdominal: Soft. Bowel sounds are normal. She exhibits no distension. There is no tenderness. There is no rebound and no guarding.  Musculoskeletal: She exhibits tenderness. She exhibits no edema.       Left knee: Tenderness found.       Cervical back: Normal.       Thoracic back: Normal.       Lumbar back: Normal.  No gross deformity, no erythema or effusion; mild abrasion and tenderness palpation left knee; mild tenderness palpation proximal humerus and distal humerus no deformity full range of motion of the elbow and shoulder; patient has some pelvic tenderness to compression, no pain with hip range of motion of her bilateral lower extremities  Neurological: She is alert. She has normal strength. No sensory deficit. Cranial nerve deficit:  no gross defecits noted. She exhibits normal muscle tone. She displays no seizure activity. Coordination normal.  Skin: Skin is warm and dry. No rash noted.  Psychiatric: She has a normal mood and affect.    ED Course  Procedures (including critical care time)  Labs Reviewed  CBC WITH DIFFERENTIAL - Abnormal; Notable for the following:    WBC 3.5 (*)    Platelets 149 (*)    Neutro Abs 1.5 (*)    Lymphocytes Relative 49 (*)    All other  components within normal limits  COMPREHENSIVE METABOLIC PANEL - Abnormal; Notable for the following:    Glucose, Bld 101 (*)    GFR calc non Af Amer 64 (*)    GFR calc Af Amer 74 (*)    All other components within normal limits  URINE CULTURE  URINALYSIS, ROUTINE W REFLEX MICROSCOPIC   Dg Hip Bilateral W/pelvis  06/04/2012  *RADIOLOGY REPORT*  Clinical Data: Fall.  Hip and pelvic pain.  BILATERAL HIP WITH PELVIS - 4+ VIEW  Comparison: None.  Findings: No evidence of hip fracture or dislocation.  No evidence of pelvic fracture.  No evidence of hip joint arthropathy.  Mild bilateral sacroiliac DJD noted.  No other pelvic bone lesions identified.  An internal left ureteral stent is seen in place which is broken both proximally and distally.  The distal portion of the stent is coiled in the urinary bladder.  IMPRESSION:  1.  No acute findings. 2.  Left ureteral stent is seen in place which is broken both proximally and distally.  Consider urology consultation.   Original Report Authenticated By: Myles Rosenthal, M.D.    Dg Knee Complete 4 Views Left  06/04/2012  *RADIOLOGY REPORT*  Clinical Data: Fall.  Anterior knee pain.  LEFT KNEE - COMPLETE 4+ VIEW  Comparison: None.  Findings: No evidence of fracture or dislocation.  No evidence of knee joint effusion.  Enthesopathic change are seen along the superior aspect the patella at the quadriceps tendon insertion site.  Spurring and soft tissue ossification is also seen adjacent to the medial femoral condyle, consistent with old MCL injury.  IMPRESSION: No acute findings.   Original Report Authenticated By: Myles Rosenthal, M.D.    Dg Humerus Right  06/04/2012  *RADIOLOGY REPORT*  Clinical Data: Fall with right arm pain.  RIGHT HUMERUS - 2+ VIEW  Comparison: None  Findings: There is no evidence of fracture, subluxation or dislocation. Mild degenerative changes within the shoulder and elbow noted. No focal bony lesions are identified. No unexpected radiopaque foreign  bodies are identified.  IMPRESSION: No evidence of acute abnormality.   Original Report Authenticated By: Harmon Pier, M.D.       MDM  Pt does not appear to have any significant injuries.  An incidental retained ureteral stent is noted on the x-ray.  Patient does not recall having this procedure done but I was able to review her medical records and it appears she had a stent placed in 2001 by Dr. Vonita Moss.  She then had a gynecological surgery by Dr. Seymour Bars in August of 2001. It  appears the stent has been in place for the last 13 years.  I've spoken with Dr. Annabell Howells.  He will follow up with patient the office. He recommends urinalysis and urine culture  I have discussed the findings with the patient and her family and the importance of follow up with Dr Annabell Howells, urology.        Celene Kras, MD 06/04/12 1321  Note: EKG ordered from triage.  Reviewed. No clinical indication for EKG.  Celene Kras, MD 06/04/12 1337  UA consistent with UTI.  Will give rx for cefuroxime.  Dose given in the ED.  Celene Kras, MD 06/04/12 [phone]

## 2012-06-07 LAB — URINE CULTURE: Colony Count: >=100000

## 2012-06-08 NOTE — ED Notes (Signed)
+   Urine Patient treated with Ceftin-sensitive to same-chart appended per protocol MD. 

## 2013-12-07 ENCOUNTER — Other Ambulatory Visit: Payer: Self-pay | Admitting: Internal Medicine

## 2013-12-07 DIAGNOSIS — R1084 Generalized abdominal pain: Secondary | ICD-10-CM

## 2013-12-12 ENCOUNTER — Inpatient Hospital Stay: Admission: RE | Admit: 2013-12-12 | Payer: Medicare Other | Source: Ambulatory Visit

## 2013-12-12 ENCOUNTER — Encounter: Payer: Self-pay | Admitting: Gastroenterology

## 2013-12-26 ENCOUNTER — Ambulatory Visit
Admission: RE | Admit: 2013-12-26 | Discharge: 2013-12-26 | Disposition: A | Discharge status: Home / Self Care | Payer: Medicare Other | Source: Ambulatory Visit | Attending: Internal Medicine | Admitting: Internal Medicine

## 2013-12-26 DIAGNOSIS — R1084 Generalized abdominal pain: Secondary | ICD-10-CM

## 2013-12-26 MED ORDER — IOHEXOL 300 MG/ML  SOLN
100.0000 mL | Freq: Once | INTRAMUSCULAR | Status: AC | PRN
  Administered 2013-12-26: 100 mL via INTRAVENOUS

## 2014-02-13 ENCOUNTER — Ambulatory Visit (INDEPENDENT_AMBULATORY_CARE_PROVIDER_SITE_OTHER): Payer: Medicare Other | Admitting: Gastroenterology

## 2014-02-13 ENCOUNTER — Other Ambulatory Visit (INDEPENDENT_AMBULATORY_CARE_PROVIDER_SITE_OTHER): Payer: Medicare Other

## 2014-02-13 ENCOUNTER — Encounter: Payer: Self-pay | Admitting: Gastroenterology

## 2014-02-13 VITALS — BP 160/90 | HR 60 | Ht 64.17 in | Wt 143.5 lb

## 2014-02-13 DIAGNOSIS — D61818 Other pancytopenia: Secondary | ICD-10-CM | POA: Diagnosis not present

## 2014-02-13 DIAGNOSIS — E538 Deficiency of other specified B group vitamins: Secondary | ICD-10-CM

## 2014-02-13 DIAGNOSIS — R9341 Abnormal radiologic findings on diagnostic imaging of renal pelvis, ureter, or bladder: Secondary | ICD-10-CM

## 2014-02-13 DIAGNOSIS — D500 Iron deficiency anemia secondary to blood loss (chronic): Secondary | ICD-10-CM | POA: Diagnosis not present

## 2014-02-13 DIAGNOSIS — R934 Abnormal findings on diagnostic imaging of urinary organs: Secondary | ICD-10-CM

## 2014-02-13 DIAGNOSIS — R1032 Left lower quadrant pain: Secondary | ICD-10-CM

## 2014-02-13 LAB — IBC PANEL
IRON: 63 ug/dL (ref 42–145)
SATURATION RATIOS: 17.5 % — AB (ref 20.0–50.0)
TRANSFERRIN: 257 mg/dL (ref 212.0–360.0)

## 2014-02-13 LAB — VITAMIN B12: VITAMIN B 12: 257 pg/mL (ref 211–911)

## 2014-02-13 LAB — FERRITIN: Ferritin: 29.1 ng/mL (ref 10.0–291.0)

## 2014-02-13 LAB — FOLATE: Folate: 11.7 ng/mL (ref >5.9)

## 2014-02-13 NOTE — Progress Notes (Signed)
History of Present Illness: This is an 80 year old female referred by Dr. Selena BattenKim. She is accompanied by her daughter. The patient and daughter have a difficult time providing details about the patient history, medications and symptoms. We eventually had to call her pharmacy to clarify her medication list. Patient has had vague left lower quadrant abdominal pain for a few months that is exacerbated by walking and movement. It does not appear to have any relation to any digestive function. She has a microcytic anemia with leukopenia and thrombocytopenia. Abdominal/pelvic CT scan results below. She has not previously had colonoscopy. The risks, benefits, and alternatives to colonoscopy with possible biopsy and possible polypectomy were discussed with the patient and they consent to proceed.   ABD/PELVIC CT IMPRESSION 12/2013: 1. There is a left double-J stent present which appears to be separated in to 3 fragments as described above. The more distal of these fragments is coiled within what appears to be a prolapsed urinary bladder. 2. Left renal calculi as well as dystrophic calcification with parenchymal loss of the left kidney. No right renal calculi are seen. 3. Small gallstones layer within the gallbladder. 4. Scattered rectosigmoid colon diverticula. No diverticulitis.   Allergies  Allergen Reactions  . Codeine Nausea And Vomiting  . Keflex [Cephalexin]     Angioedema   Outpatient Prescriptions Prior to Visit  Medication Sig Dispense Refill  . furosemide (LASIX) 20 MG tablet Take 20 mg by mouth daily.    . rosuvastatin (CRESTOR) 20 MG tablet Take 20 mg by mouth daily.    Marland Kitchen. acetaminophen (TYLENOL) 500 MG tablet Take 500 mg by mouth every 6 (six) hours as needed for pain.    . cefUROXime (CEFTIN) 250 MG tablet Take 1 tablet (250 mg total) by mouth 2 (two) times daily. 14 tablet 0  . cyanocobalamin (,VITAMIN B-12,) 1000 MCG/ML injection Inject 1,000 mcg into the muscle every 30 (thirty)  days.    Marland Kitchen. donepezil (ARICEPT) 5 MG tablet Take 5 mg by mouth at bedtime.    . traMADol (ULTRAM) 50 MG tablet Take 1 tablet (50 mg total) by mouth every 6 (six) hours as needed for pain. 15 tablet 0   No facility-administered medications prior to visit.   Past Medical History  Diagnosis Date  . Hypertension   . Diabetes mellitus without complication   . Hyperlipidemia   . Vitamin B12 deficiency   . Anemia   . Osteopenia   . Dementia   . DDD (degenerative disc disease), lumbar   . Memory difficulty    Past Surgical History  Procedure Laterality Date  . Total abdominal hysterectomy w/ bilateral salpingoophorectomy  10/1999  . Ureteral stent placement  2001  . Cholecystectomy  2004    R 08/29/1998, L 12/01/1998  . Cataract extraction     History   Social History  . Marital Status: Single    Spouse Name: N/A    Number of Children: 3  . Years of Education: N/A   Occupational History  . retired    Social History Main Topics  . Smoking status: Never Smoker   . Smokeless tobacco: Never Used  . Alcohol Use: No  . Drug Use: No  . Sexual Activity: None   Other Topics Concern  . None   Social History Narrative   Family History  Problem Relation Age of Onset  . Diabetes Mother   . Glaucoma Mother   . Cancer Brother     type unknown  . Hypertension Father   .  CVA Father   . Heart disease Mother     Review of Systems: Pertinent positive and negative review of systems were noted in the above HPI section. All other review of systems were otherwise negative.   Physical Exam: General: Well developed , well nourished, no acute distress Head: Normocephalic and atraumatic Eyes:  sclerae anicteric, EOMI Ears: Normal auditory acuity Mouth: No deformity or lesions Neck: Supple, no masses or thyromegaly Lungs: Clear throughout to auscultation Heart: Regular rate and rhythm; no murmurs, rubs or bruits Abdomen: Soft, Minimal left lower quadrant tenderness to deep palpation  without rebound or guarding and non distended. No masses, hepatosplenomegaly or hernias noted. Normal Bowel sounds Musculoskeletal: Symmetrical with no gross deformities  Skin: No lesions on visible extremities Pulses:  Normal pulses noted Extremities: No clubbing, cyanosis, edema or deformities noted Neurological: Alert oriented x 4, grossly nonfocal Cervical Nodes:  No significant cervical adenopathy Inguinal Nodes: No significant inguinal adenopathy Psychological:  Alert and cooperative. Dementia. Normal mood and affect  Assessment and Recommendations:  1. Left lower quadrant pain that is exacerbated by walking and bending. Symptoms may be related to double-J stent in the left ureter/bladder which is fractured in 3 parts. This should be further evaluated by Urology-Dr. Selena BattenKim to provide referral. Symptoms may be musculoskeletal. Possible gastrointestinal symptoms and will defer further evaluation until she has been evaluated by urology.   2. Microcytic anemia with leukopenia and thrombocytopenia. Check iron studies, B12 and folate. Stool Hemoccults.

## 2014-02-13 NOTE — Patient Instructions (Addendum)
Go to the basement for labs today Follow up with Dr Selena BattenKim for Urology referral for broken renal stent shown on CT Scan Your follow up appointment with Dr Russella DarStark is on 04/03/2014 at 9:15am We have given you hemoccult cards to take home please mail back in the self addressed envelope as soon as you complete the cards

## 2014-04-03 ENCOUNTER — Ambulatory Visit: Payer: Medicare Other | Admitting: Gastroenterology

## 2014-04-04 ENCOUNTER — Telehealth: Payer: Self-pay | Admitting: Gastroenterology

## 2014-04-04 NOTE — Telephone Encounter (Signed)
-----   Message from Jessee AversAmanda L Lewellyn, CMA sent at 04/04/2014  8:34 AM EST ----- Bill patient for no show

## 2014-04-04 NOTE — Progress Notes (Signed)
Patient ID: Storm FriskJoan E [name], female   DOB: [birth year], 81 y.o.   MRN: [id number] The patient's chart has been reviewed by Dr. Russella DarStark  and the recommendations are noted below.   Follow-up advised. Contact patient and schedule visit in a few  weeks.  Outcome of communication with the patient: Unable to reach patient. Will send letter.

## 2014-06-06 DIAGNOSIS — N390 Urinary tract infection, site not specified: Secondary | ICD-10-CM | POA: Diagnosis not present

## 2014-06-06 DIAGNOSIS — Z960 Presence of urogenital implants: Secondary | ICD-10-CM | POA: Diagnosis not present

## 2014-07-10 DIAGNOSIS — N390 Urinary tract infection, site not specified: Secondary | ICD-10-CM | POA: Diagnosis not present

## 2014-07-10 DIAGNOSIS — Z960 Presence of urogenital implants: Secondary | ICD-10-CM | POA: Diagnosis not present

## 2014-07-11 ENCOUNTER — Other Ambulatory Visit: Payer: Self-pay | Admitting: Urology

## 2014-07-12 ENCOUNTER — Other Ambulatory Visit: Payer: Self-pay | Admitting: Urology

## 2014-07-29 ENCOUNTER — Encounter (HOSPITAL_BASED_OUTPATIENT_CLINIC_OR_DEPARTMENT_OTHER): Payer: Self-pay | Admitting: *Deleted

## 2014-07-29 NOTE — Progress Notes (Signed)
SPOKE W/ PT'S NIECE, ANN MCCOY.  NPO AFTER MN. ARRIVE AT [id number].  NEEDS ISTAT AND EKG. WILL TAKE NORVASC AND LOSARTAN AM DOS W/ SIPS OF WATER.

## 2014-07-30 ENCOUNTER — Ambulatory Visit (HOSPITAL_BASED_OUTPATIENT_CLINIC_OR_DEPARTMENT_OTHER): Payer: Medicare Other | Admitting: Certified Registered"

## 2014-07-30 ENCOUNTER — Encounter (HOSPITAL_BASED_OUTPATIENT_CLINIC_OR_DEPARTMENT_OTHER): Payer: Self-pay | Admitting: Certified Registered"

## 2014-07-30 ENCOUNTER — Ambulatory Visit (HOSPITAL_BASED_OUTPATIENT_CLINIC_OR_DEPARTMENT_OTHER)
Admission: RE | Admit: 2014-07-30 | Discharge: 2014-07-30 | Disposition: A | Discharge status: Home / Self Care | Payer: Medicare Other | Source: Ambulatory Visit | Attending: Urology | Admitting: Urology

## 2014-07-30 ENCOUNTER — Encounter (HOSPITAL_BASED_OUTPATIENT_CLINIC_OR_DEPARTMENT_OTHER)
Admission: RE | Disposition: A | Discharge status: Home / Self Care | Payer: Self-pay | Source: Ambulatory Visit | Attending: Urology

## 2014-07-30 DIAGNOSIS — Z466 Encounter for fitting and adjustment of urinary device: Secondary | ICD-10-CM | POA: Diagnosis present

## 2014-07-30 DIAGNOSIS — N390 Urinary tract infection, site not specified: Secondary | ICD-10-CM | POA: Diagnosis not present

## 2014-07-30 DIAGNOSIS — T198XXA Foreign body in other parts of genitourinary tract, initial encounter: Secondary | ICD-10-CM | POA: Diagnosis not present

## 2014-07-30 DIAGNOSIS — I10 Essential (primary) hypertension: Secondary | ICD-10-CM | POA: Insufficient documentation

## 2014-07-30 DIAGNOSIS — N201 Calculus of ureter: Secondary | ICD-10-CM | POA: Diagnosis not present

## 2014-07-30 DIAGNOSIS — N200 Calculus of kidney: Secondary | ICD-10-CM | POA: Insufficient documentation

## 2014-07-30 DIAGNOSIS — T8389XA Other specified complication of genitourinary prosthetic devices, implants and grafts, initial encounter: Secondary | ICD-10-CM | POA: Diagnosis not present

## 2014-07-30 DIAGNOSIS — E119 Type 2 diabetes mellitus without complications: Secondary | ICD-10-CM | POA: Insufficient documentation

## 2014-07-30 DIAGNOSIS — T199XXA Foreign body in genitourinary tract, part unspecified, initial encounter: Secondary | ICD-10-CM | POA: Diagnosis not present

## 2014-07-30 HISTORY — DX: Presence of dental prosthetic device (complete) (partial): K08.109

## 2014-07-30 HISTORY — DX: Diverticulosis of large intestine without perforation or abscess without bleeding: K57.30

## 2014-07-30 HISTORY — PX: URETEROSCOPY: SHX842

## 2014-07-30 HISTORY — DX: Other amnesia: R41.3

## 2014-07-30 HISTORY — DX: Type 2 diabetes mellitus without complications: E11.9

## 2014-07-30 HISTORY — DX: Calculus of kidney: N20.0

## 2014-07-30 HISTORY — DX: Presence of urogenital implants: Z96.0

## 2014-07-30 HISTORY — DX: Presence of dental prosthetic device (complete) (partial): Z97.2

## 2014-07-30 HISTORY — PX: CYSTOSCOPY W/ URETERAL STENT REMOVAL: SHX1430

## 2014-07-30 LAB — POCT I-STAT 4, (NA,K, GLUC, HGB,HCT)
GLUCOSE: 97 mg/dL (ref 70–99)
HCT: 38 % (ref 36.0–46.0)
HEMOGLOBIN: 12.9 g/dL (ref 12.0–15.0)
POTASSIUM: 4.8 mmol/L (ref 3.5–5.1)
Sodium: 141 mmol/L (ref 135–145)

## 2014-07-30 LAB — GLUCOSE, CAPILLARY: Glucose-Capillary: 118 mg/dL — ABNORMAL HIGH (ref 70–99)

## 2014-07-30 SURGERY — URETEROSCOPY
Anesthesia: General | Site: Ureter | Laterality: Left

## 2014-07-30 MED ORDER — FENTANYL CITRATE (PF) 100 MCG/2ML IJ SOLN
INTRAMUSCULAR | Status: AC
  Filled 2014-07-30: qty 4

## 2014-07-30 MED ORDER — FENTANYL CITRATE (PF) 100 MCG/2ML IJ SOLN
INTRAMUSCULAR | Status: AC
  Filled 2014-07-30: qty 2

## 2014-07-30 MED ORDER — LACTATED RINGERS IV SOLN
INTRAVENOUS | Status: DC
  Administered 2014-07-30 (×2): via INTRAVENOUS
  Filled 2014-07-30: qty 1000

## 2014-07-30 MED ORDER — CIPROFLOXACIN IN D5W 400 MG/200ML IV SOLN
INTRAVENOUS | Status: AC
  Filled 2014-07-30: qty 200

## 2014-07-30 MED ORDER — LIDOCAINE HCL (CARDIAC) 20 MG/ML IV SOLN
INTRAVENOUS | Status: DC | PRN
  Administered 2014-07-30: 60 mg via INTRAVENOUS

## 2014-07-30 MED ORDER — CIPROFLOXACIN IN D5W 400 MG/200ML IV SOLN
INTRAVENOUS | Status: DC | PRN
  Administered 2014-07-30: 400 mg via INTRAVENOUS

## 2014-07-30 MED ORDER — EPHEDRINE SULFATE 50 MG/ML IJ SOLN
INTRAMUSCULAR | Status: DC | PRN
  Administered 2014-07-30: 10 mg via INTRAVENOUS

## 2014-07-30 MED ORDER — SODIUM CHLORIDE 0.9 % IR SOLN
Status: DC | PRN
  Administered 2014-07-30: 3000 mL

## 2014-07-30 MED ORDER — PROPOFOL 10 MG/ML IV BOLUS
INTRAVENOUS | Status: DC | PRN
  Administered 2014-07-30: 150 mg via INTRAVENOUS

## 2014-07-30 MED ORDER — DEXAMETHASONE SODIUM PHOSPHATE 4 MG/ML IJ SOLN
INTRAMUSCULAR | Status: DC | PRN
  Administered 2014-07-30: 4 mg via INTRAVENOUS

## 2014-07-30 MED ORDER — STERILE WATER FOR IRRIGATION IR SOLN
Status: DC | PRN
  Administered 2014-07-30: 3000 mL

## 2014-07-30 MED ORDER — ONDANSETRON HCL 4 MG/2ML IJ SOLN
INTRAMUSCULAR | Status: DC | PRN
  Administered 2014-07-30: 4 mg via INTRAVENOUS

## 2014-07-30 MED ORDER — MEPERIDINE HCL 25 MG/ML IJ SOLN
6.2500 mg | INTRAMUSCULAR | Status: DC | PRN
  Filled 2014-07-30: qty 1

## 2014-07-30 MED ORDER — FENTANYL CITRATE (PF) 100 MCG/2ML IJ SOLN
INTRAMUSCULAR | Status: DC | PRN
  Administered 2014-07-30: 50 ug via INTRAVENOUS
  Administered 2014-07-30 (×3): 25 ug via INTRAVENOUS

## 2014-07-30 MED ORDER — FENTANYL CITRATE (PF) 100 MCG/2ML IJ SOLN
25.0000 ug | INTRAMUSCULAR | Status: DC | PRN
  Administered 2014-07-30 (×2): 25 ug via INTRAVENOUS
  Filled 2014-07-30: qty 1

## 2014-07-30 MED ORDER — PROMETHAZINE HCL 25 MG/ML IJ SOLN
6.2500 mg | INTRAMUSCULAR | Status: DC | PRN
  Filled 2014-07-30: qty 1

## 2014-07-30 SURGICAL SUPPLY — 47 items
ADAPTER CATH URET PLST 4-6FR (CATHETERS) IMPLANT
ADPR CATH URET STRL DISP 4-6FR (CATHETERS)
BAG DRAIN URO-CYSTO SKYTR STRL (DRAIN) ×3 IMPLANT
BAG DRN UROCATH (DRAIN) ×1
BASKET LASER NITINOL 1.9FR (BASKET) IMPLANT
BASKET SEGURA 3FR (UROLOGICAL SUPPLIES) IMPLANT
BASKET STNLS GEMINI 4WIRE 3FR (BASKET) IMPLANT
BASKET ZERO TIP NITINOL 2.4FR (BASKET) IMPLANT
BSKT STON RTRVL 120 1.9FR (BASKET)
BSKT STON RTRVL GEM 120X11 3FR (BASKET)
BSKT STON RTRVL ZERO TP 2.4FR (BASKET)
CANISTER SUCT LVC 12 LTR MEDI- (MISCELLANEOUS) ×3 IMPLANT
CATH INTERMIT  6FR 70CM (CATHETERS) IMPLANT
CATH ROBINSON RED A/P 14FR (CATHETERS) ×3 IMPLANT
CATH URET 5FR 28IN CONE TIP (BALLOONS)
CATH URET 5FR 28IN OPEN ENDED (CATHETERS) ×3 IMPLANT
CATH URET 5FR 70CM CONE TIP (BALLOONS) IMPLANT
CLOTH BEACON ORANGE TIMEOUT ST (SAFETY) ×3 IMPLANT
ELECT REM PT RETURN 9FT ADLT (ELECTROSURGICAL)
ELECTRODE REM PT RTRN 9FT ADLT (ELECTROSURGICAL) IMPLANT
FIBER LASER FLEXIVA 1000 (UROLOGICAL SUPPLIES) IMPLANT
FIBER LASER FLEXIVA 365 (UROLOGICAL SUPPLIES) IMPLANT
FIBER LASER FLEXIVA 550 (UROLOGICAL SUPPLIES) IMPLANT
FIBER LASER TRAC TIP (UROLOGICAL SUPPLIES) ×3 IMPLANT
FORCEPS GRASP N/RETR BSKT (INSTRUMENTS) ×3 IMPLANT
GLOVE BIO SURGEON STRL SZ 6.5 (GLOVE) ×2 IMPLANT
GLOVE BIO SURGEON STRL SZ7 (GLOVE) ×3 IMPLANT
GLOVE BIO SURGEON STRL SZ7.5 (GLOVE) ×3 IMPLANT
GLOVE BIO SURGEONS STRL SZ 6.5 (GLOVE) ×1
GLOVE INDICATOR 7.5 STRL GRN (GLOVE) ×6 IMPLANT
GLOVE SURG SS PI 7.5 STRL IVOR (GLOVE) ×6 IMPLANT
GOWN STRL REUS W/ TWL XL LVL3 (GOWN DISPOSABLE) ×1 IMPLANT
GOWN STRL REUS W/TWL XL LVL3 (GOWN DISPOSABLE) ×3
GOWN XL W/COTTON TOWEL STD (GOWNS) ×3 IMPLANT
GUIDEWIRE 0.038 PTFE COATED (WIRE) ×3 IMPLANT
GUIDEWIRE ANG ZIPWIRE 038X150 (WIRE) ×3 IMPLANT
GUIDEWIRE STR DUAL SENSOR (WIRE) ×6 IMPLANT
IV NS IRRIG 3000ML ARTHROMATIC (IV SOLUTION) ×6 IMPLANT
KIT BALLIN UROMAX 15FX10 (LABEL) IMPLANT
KIT BALLN UROMAX 15FX4 (MISCELLANEOUS) IMPLANT
KIT BALLN UROMAX 26 75X4 (MISCELLANEOUS)
NS IRRIG 500ML POUR BTL (IV SOLUTION) IMPLANT
PACK CYSTO (CUSTOM PROCEDURE TRAY) ×3 IMPLANT
RETRIEVER STENT NSNARE (INSTRUMENTS) ×3 IMPLANT
SET HIGH PRES BAL DIL (LABEL)
SHEATH ACCESS URETERAL 38CM (SHEATH) ×3 IMPLANT
WATER STERILE IRR 3000ML UROMA (IV SOLUTION) ×3 IMPLANT

## 2014-07-30 NOTE — H&P (Signed)
History of Present Illness [name] is known to have a retained JJ stent that was inserted in 2001 by Dr [name]. There are at least 3 fragments of the stent: one in the renal pelvis and proximal ureter, one in the mid ureter and another fragment or 2 in the bladder. She reports that the left sided lower abdominal pain is getting worse. She was advised at least twice to have cystoscopy and attempt to remove the fragments of the stent in the bladder. She was very anxious about it and did not want to proceed. I discussed again with her and her niece regarding cystoscopy and stent fragments removal. She would prefer to have the procedure under anesthesia. I told them that I do not know whether her pain is related to the bladder fragments or the ureteral or renal fragments. Urinalysis shows TNTC WBCs, moderate bacteria, large leukocyte esterase, nitrite positive.   Past Medical History Problems  1. History of diabetes mellitus (Z86.39) 2. History of hypertension (Z86.79)  Surgical History Problems  1. History of Cystoscopy With Insertion Of Ureteral Stent  Current Meds 1. AmLODIPine Besylate TABS;  Therapy: (Recorded:09Dec2015) to Recorded 2. Donepezil HCl TABS;  Therapy: (Recorded:09Dec2015) to Recorded  Allergies Medication  1. No Known Drug Allergies  Family History Problems  1. Family history of prostate cancer (Z80.42) : Brother  Social History Problems  1. Denied: History of Alcohol use 2. Caffeine use (F15.90) 3. Father deceased 554. Mother deceased 255. Never a smoker 6. Number of children   3 sons 7. Single  Review of Systems Genitourinary, constitutional, skin, eye, otolaryngeal, hematologic/lymphatic, cardiovascular, pulmonary, endocrine, musculoskeletal, gastrointestinal, neurological and psychiatric system(s) were reviewed and pertinent findings if present are noted and are otherwise negative.  Gastrointestinal: abdominal pain.    Physical Exam Constitutional: Well  nourished and well developed . No acute distress.  ENT:. The ears and nose are normal in appearance.  Neck: The appearance of the neck is normal and no neck mass is present.  Pulmonary: No respiratory distress and normal respiratory rhythm and effort.  Cardiovascular: Heart rate and rhythm are normal . No peripheral edema.  Abdomen: The abdomen is soft and nontender. No masses are palpated. No CVA tenderness. No hernias are palpable. No hepatosplenomegaly noted.  Genitourinary:  The bladder is non tender and not distended.  Lymphatics: The femoral and inguinal nodes are not enlarged or tender.  Skin: Normal skin turgor, no visible rash and no visible skin lesions.  Neuro/Psych:. Mood and affect are appropriate.    Results/Data Urine [Data Includes: Last 1 Day]   20Apr2016  COLOR YELLOW   APPEARANCE CLOUDY   SPECIFIC GRAVITY 1.020   pH 6.0   GLUCOSE NEG mg/dL  BILIRUBIN NEG   KETONE NEG mg/dL  BLOOD MOD   PROTEIN TRACE mg/dL  UROBILINOGEN 0.2 mg/dL  NITRITE POS   LEUKOCYTE ESTERASE LARGE   SQUAMOUS EPITHELIAL/HPF FEW   WBC TNTC WBC/hpf  RBC 0-2 RBC/hpf  BACTERIA MODERATE   CRYSTALS NONE SEEN   CASTS NONE SEEN    Assessment Assessed  1. Urinary tract infection (N39.0) 2. Retained ureteral stent (Z96.0)  Plan Health Maintenance  1. UA With REFLEX; [Do Not Release]; Status:Complete;   Done: 20Apr2016 10:44AM Urinary tract infection  2. URINE CULTURE; Status:Hold For - Specimen/Data Collection,Appointment; Requested  for:20Apr2016;   I explained to her and her niece that I do not know whether I will be able to remove all stent fragments. I do know how friable the stent fragments  are. But I will do my best to remove all fragments. They understand and are agreeable.

## 2014-07-30 NOTE — Transfer of Care (Signed)
Immediate Anesthesia Transfer of Care Note  Patient: [name]  Procedure(s) Performed: Procedure(s) (LRB): URETEROSCOPY (Left) CYSTOSCOPY WITH FRAGMENTED STENT REMOVAL (Left)  Patient Location: PACU  Anesthesia Type: General  Level of Consciousness: awake, oriented, sedated and patient cooperative  Airway & Oxygen Therapy: Patient Spontanous Breathing and Patient connected to face mask oxygen  Post-op Assessment: Report given to PACU RN and Post -op Vital signs reviewed and stable  Post vital signs: Reviewed and stable  Complications: No apparent anesthesia complications

## 2014-07-30 NOTE — Discharge Instructions (Signed)
CYSTOSCOPY HOME CARE INSTRUCTIONS ° °Activity: °Rest for the remainder of the day.  Do not drive or operate equipment today.  You may resume normal activities in one to two days as instructed by your physician.  ° °Meals: °Drink plenty of liquids and eat light foods such as gelatin or soup this evening.  You may return to a normal meal plan tomorrow. ° °Return to Work: °You may return to work in one to two days or as instructed by your physician. ° °Special Instructions / Symptoms: °Call your physician if any of these symptoms occur: ° ° -persistent or heavy bleeding ° -bleeding which continues after first few urination ° -large blood clots that are difficult to pass ° -urine stream diminishes or stops completely ° -fever equal to or higher than 101 degrees Farenheit. ° -cloudy urine with a strong, foul odor ° -severe pain ° °Females should always wipe from front to back after elimination.  You may feel some burning pain when you urinate.  This should disappear with time.  Applying moist heat to the lower abdomen or a hot tub bath may help relieve the pain. \ ° ° ° ° °Post Anesthesia Home Care Instructions ° °Activity: °Get plenty of rest for the remainder of the day. A responsible adult should stay with you for 24 hours following the procedure.  °For the next 24 hours, DO NOT: °-Drive a car °-Operate machinery °-Drink alcoholic beverages °-Take any medication unless instructed by your physician °-Make any legal decisions or sign important papers. ° °Meals: °Start with liquid foods such as gelatin or soup. Progress to regular foods as tolerated. Avoid greasy, spicy, heavy foods. If nausea and/or vomiting occur, drink only clear liquids until the nausea and/or vomiting subsides. Call your physician if vomiting continues. ° °Special Instructions/Symptoms: °Your throat may feel dry or sore from the anesthesia or the breathing tube placed in your throat during surgery. If this causes discomfort, gargle with warm salt  water. The discomfort should disappear within 24 hours. ° °If you had a scopolamine patch placed behind your ear for the management of post- operative nausea and/or vomiting: ° °1. The medication in the patch is effective for 72 hours, after which it should be removed.  Wrap patch in a tissue and discard in the trash. Wash hands thoroughly with soap and water. °2. You may remove the patch earlier than 72 hours if you experience unpleasant side effects which may include dry mouth, dizziness or visual disturbances. °3. Avoid touching the patch. Wash your hands with soap and water after contact with the patch. °  ° °

## 2014-07-30 NOTE — Anesthesia Procedure Notes (Signed)
Procedure Name: LMA Insertion Date/Time: 07/30/2014 10:32 AM Performed by: Renella CunasHAZEL, Analyssa Downs D Pre-anesthesia Checklist: Patient identified, Emergency Drugs available, Suction available and Patient being monitored Patient Re-evaluated:Patient Re-evaluated prior to inductionOxygen Delivery Method: Circle System Utilized Preoxygenation: Pre-oxygenation with 100% oxygen Intubation Type: IV induction Ventilation: Mask ventilation without difficulty LMA: LMA with gastric port inserted LMA Size: 4.0 Number of attempts: 1 Airway Equipment and Method: Bite block Placement Confirmation: positive ETCO2 Tube secured with: Tape Dental Injury: Teeth and Oropharynx as per pre-operative assessment

## 2014-07-30 NOTE — Anesthesia Preprocedure Evaluation (Signed)
Anesthesia Evaluation  Patient identified by MRN, date of birth, ID band Patient awake    Reviewed: Allergy & Precautions, NPO status , Patient's Chart, lab work & pertinent test results  Airway Mallampati: II  TM Distance: >3 FB Neck ROM: Full    Dental no notable dental hx. (+) Edentulous Upper, Edentulous Lower, Lower Dentures, Upper Dentures   Pulmonary neg pulmonary ROS,  breath sounds clear to auscultation  Pulmonary exam normal       Cardiovascular hypertension, Pt. on medications Normal cardiovascular examRhythm:Regular Rate:Normal     Neuro/Psych Memory loss  negative psych ROS   GI/Hepatic negative GI ROS, Neg liver ROS,   Endo/Other  diabetes  Renal/GU negative Renal ROS  negative genitourinary   Musculoskeletal negative musculoskeletal ROS (+)   Abdominal   Peds negative pediatric ROS (+)  Hematology negative hematology ROS (+)   Anesthesia Other Findings   Reproductive/Obstetrics negative OB ROS                             Anesthesia Physical Anesthesia Plan  ASA: II  Anesthesia Plan: General   Post-op Pain Management:    Induction: Intravenous  Airway Management Planned: LMA  Additional Equipment:   Intra-op Plan:   Post-operative Plan:   Informed Consent: I have reviewed the patients History and Physical, chart, labs and discussed the procedure including the risks, benefits and alternatives for the proposed anesthesia with the patient or authorized representative who has indicated his/her understanding and acceptance.   Dental advisory given  Plan Discussed with: CRNA  Anesthesia Plan Comments:         Anesthesia Quick Evaluation

## 2014-07-30 NOTE — Op Note (Signed)
Preoperative diagnosis: Left retained / fragmented stent  Postoperative diagnosis: Same  Procedure:  1. Cystoscopy 2. Left ureteroscopy and fragmented stent removal 3. Ureteroscopic laser lithotripsy of stent encrustation and fragmentation of stent  Surgeon: Brunilda PayorNesi  Resident: Adela LankE. Will Lua Feng, MD  Anesthesia: General  Complications: None  Intraoperative findings: Stent fragmented in three pieces as expected.  One piece in bladder, one in ureter and on in collecting system.  All stent pieces were encrusted with stone.  Laser lithotripsy of stent in renal pelvis was required to remove that fragment.  All pieces successfully removed.    EBL: Minimal  Specimens: 1. None  Disposition of specimens: [facility] for stone analysis  Indication: [name] is a 81 y.o. female patient with history of left sided stent placement 10 years ago.  Stent was never removed and is now in three separate pieces.  After reviewing the management options for treatment, they elected to proceed with the above surgical procedure(s). We have discussed the potential benefits and risks of the procedure, side effects of the proposed treatment, the likelihood of the patient achieving the goals of the procedure, and any potential problems that might occur during the procedure or recuperation. Informed consent has been obtained.  Description of procedure:  The patient was taken to the operating room and general anesthesia was induced.  The patient was placed in the dorsal lithotomy position, prepped and draped in the usual sterile fashion, and preoperative antibiotics were administered. A preoperative time-out was performed.   Cystourethroscopy was performed.  The patient's urethra was examined and was normal. The bladder was then systematically examined in its entirety. There was no evidence for any bladder tumors, stones, or other mucosal pathology.  Distal curl of the stent was seen in the bladder  and this fragment was removed with a grasper.   Attention then turned to the Left ureteral orifice and a ureteral catheter was used to intubate the ureteral orifice.  A 0.38 sensor guidewire was then advanced up the Left ureter into the renal pelvis under fluoroscopic guidance.  Semirigid ureteroscope was then used to perform left sided ureteroscopy and the second stent fragment could be seen in the left ureter.  This was grasped with a ensnare basket and the stent was removed.    A 12/14 Fr ureteral access sheath was then advanced over the left guide wire with a second safety wire outside the access sheath. The digital flexible ureteroscope was then advanced through the access sheath into the ureter and up into the renal pelvis.  The final stent fragment was seen here and was more encrusted with stone.   The stone was then fragmented with the 200 micron holmium laser fiber.  The stent was further fragmented into two separate pieces to facilitate basketing.  The ensnare basket was again used to grab the stent fragments and remove them. At this point all pieces had successfully been removed.     The bladder was then emptied and the procedure ended.  The patient appeared to tolerate the procedure well and without complications.  The patient was able to be awakened and transferred to the recovery unit in satisfactory condition.

## 2014-07-31 ENCOUNTER — Encounter (HOSPITAL_BASED_OUTPATIENT_CLINIC_OR_DEPARTMENT_OTHER): Payer: Self-pay | Admitting: Urology

## 2014-07-31 NOTE — Anesthesia Postprocedure Evaluation (Signed)
  Anesthesia Post-op Note  Patient: [name]  Procedure(s) Performed: Procedure(s) (LRB): URETEROSCOPY (Left) CYSTOSCOPY WITH FRAGMENTED STENT REMOVAL (Left)  Patient Location: PACU  Anesthesia Type: General  Level of Consciousness: awake and alert   Airway and Oxygen Therapy: Patient Spontanous Breathing  Post-op Pain: mild  Post-op Assessment: Post-op Vital signs reviewed, Patient's Cardiovascular Status Stable, Respiratory Function Stable, Patent Airway and No signs of Nausea or vomiting  Last Vitals:  Filed Vitals:   07/30/14 1530  BP: 169/82  Pulse: 81  Temp: 36.5 C  Resp: 17    Post-op Vital Signs: stable   Complications: No apparent anesthesia complications

## 2014-08-20 DIAGNOSIS — R8290 Unspecified abnormal findings in urine: Secondary | ICD-10-CM | POA: Diagnosis not present

## 2014-08-20 DIAGNOSIS — N390 Urinary tract infection, site not specified: Secondary | ICD-10-CM | POA: Diagnosis not present

## 2014-10-17 ENCOUNTER — Encounter (HOSPITAL_COMMUNITY): Payer: Self-pay | Admitting: Emergency Medicine

## 2014-10-17 ENCOUNTER — Emergency Department (HOSPITAL_COMMUNITY)
Admission: EM | Admit: 2014-10-17 | Discharge: 2014-10-18 | Disposition: A | Discharge status: Home / Self Care | Payer: Medicare Other | Attending: Emergency Medicine | Admitting: Emergency Medicine

## 2014-10-17 DIAGNOSIS — E119 Type 2 diabetes mellitus without complications: Secondary | ICD-10-CM | POA: Diagnosis not present

## 2014-10-17 DIAGNOSIS — Z7982 Long term (current) use of aspirin: Secondary | ICD-10-CM | POA: Diagnosis not present

## 2014-10-17 DIAGNOSIS — Z8739 Personal history of other diseases of the musculoskeletal system and connective tissue: Secondary | ICD-10-CM | POA: Insufficient documentation

## 2014-10-17 DIAGNOSIS — Z79899 Other long term (current) drug therapy: Secondary | ICD-10-CM | POA: Insufficient documentation

## 2014-10-17 DIAGNOSIS — Z8719 Personal history of other diseases of the digestive system: Secondary | ICD-10-CM | POA: Diagnosis not present

## 2014-10-17 DIAGNOSIS — N390 Urinary tract infection, site not specified: Secondary | ICD-10-CM | POA: Diagnosis not present

## 2014-10-17 DIAGNOSIS — Z9049 Acquired absence of other specified parts of digestive tract: Secondary | ICD-10-CM | POA: Insufficient documentation

## 2014-10-17 DIAGNOSIS — I10 Essential (primary) hypertension: Secondary | ICD-10-CM | POA: Insufficient documentation

## 2014-10-17 DIAGNOSIS — R1033 Periumbilical pain: Secondary | ICD-10-CM | POA: Diagnosis present

## 2014-10-17 DIAGNOSIS — Z87442 Personal history of urinary calculi: Secondary | ICD-10-CM | POA: Insufficient documentation

## 2014-10-17 LAB — CBC
HEMATOCRIT: 38.4 % (ref 36.0–46.0)
Hemoglobin: 13 g/dL (ref 12.0–15.0)
MCH: 27.6 pg (ref 26.0–34.0)
MCHC: 33.9 g/dL (ref 30.0–36.0)
MCV: 81.5 fL (ref 78.0–100.0)
Platelets: 142 10*3/uL — ABNORMAL LOW (ref 150–400)
RBC: 4.71 MIL/uL (ref 3.87–5.11)
RDW: 14 % (ref 11.5–15.5)
WBC: 7.3 10*3/uL (ref 4.0–10.5)

## 2014-10-17 LAB — COMPREHENSIVE METABOLIC PANEL
ALT: 10 U/L — ABNORMAL LOW (ref 14–54)
AST: 18 U/L (ref 15–41)
Albumin: 4.4 g/dL (ref 3.5–5.0)
Alkaline Phosphatase: 73 U/L (ref 38–126)
Anion gap: 9 (ref 5–15)
BUN: 11 mg/dL (ref 6–20)
CHLORIDE: 111 mmol/L (ref 101–111)
CO2: 22 mmol/L (ref 22–32)
Calcium: 9.1 mg/dL (ref 8.9–10.3)
Creatinine, Ser: 0.99 mg/dL (ref 0.44–1.00)
GFR calc Af Amer: 60 mL/min — ABNORMAL LOW (ref >60)
GFR calc non Af Amer: 52 mL/min — ABNORMAL LOW (ref >60)
GLUCOSE: 126 mg/dL — AB (ref 65–99)
POTASSIUM: 3.7 mmol/L (ref 3.5–5.1)
Sodium: 142 mmol/L (ref 135–145)
TOTAL PROTEIN: 8 g/dL (ref 6.5–8.1)
Total Bilirubin: 0.9 mg/dL (ref 0.3–1.2)

## 2014-10-17 LAB — URINALYSIS, ROUTINE W REFLEX MICROSCOPIC
BILIRUBIN URINE: NEGATIVE
GLUCOSE, UA: NEGATIVE mg/dL
Ketones, ur: NEGATIVE mg/dL
NITRITE: NEGATIVE
PH: 6.5 (ref 5.0–8.0)
PROTEIN: NEGATIVE mg/dL
Specific Gravity, Urine: 1.01 (ref 1.005–1.030)
UROBILINOGEN UA: 0.2 mg/dL (ref 0.0–1.0)

## 2014-10-17 LAB — URINE MICROSCOPIC-ADD ON

## 2014-10-17 LAB — LIPASE, BLOOD: LIPASE: 20 U/L — AB (ref 22–51)

## 2014-10-17 NOTE — ED Notes (Signed)
Pt arrived to the ED with a complaint of upper medial abdominal pain.  Pt states she has had issues with her abdomen previously.  Pt states that the present pain began three weeks ago.  Pain radiates downward.  Pt denies urinary or bowel issues.  Pt also complaining of right leg pain.

## 2014-10-18 MED ORDER — SULFAMETHOXAZOLE-TRIMETHOPRIM 800-160 MG PO TABS: 1.0000 | ORAL_TABLET | Freq: Two times a day (BID) | ORAL | Status: DC

## 2014-10-18 MED ORDER — SULFAMETHOXAZOLE-TRIMETHOPRIM 800-160 MG PO TABS
1.0000 | ORAL_TABLET | Freq: Once | ORAL | Status: AC
  Administered 2014-10-18: 1 via ORAL
  Filled 2014-10-18: qty 1

## 2014-10-18 NOTE — ED Provider Notes (Signed)
CSN: [id number]     Arrival date & time 10/17/14  2211 History  This chart was scribed for Kimberly Albe, MD by Lyndel Safe, ED Scribe. This patient was seen in room WA12/WA12 and the patient's care was started 2:30 AM.   Chief Complaint  Patient presents with  . Abdominal Pain   The history is provided by the patient. No language interpreter was used.   Level V caveat for dementia  HPI Comments: [name] is a 81 y.o. female, with a PMhx of HTN, HLD, and DM, who presents to the Emergency Department complaining of progressively worsening, constant, suprapubic and periumbilical abdominal pain onset 4 weeks ago that significantly worsened yesterday. She reports associated decrease in appetite. Pt notes ambulation exacerbates her abdominal pain. She does not note any alleviating factors. Pt recently had a cystoscopy with ureteral stent removal due to fragmentation of stent approximately 3 months ago (the stent had been left in place for 10 years). Also notes a PShx of hysterectomy, cholecystectomy, and appendectomy. The pt resides with her son who is not present in the room. Family member notes the son who pt lives with has not mentioned any complaints from the pt. Pt takes HTN medication as prescribed but reports she does not follow up with her PCP regularly. Pt denies fever,bladder incontinence, dysuria, urgency, frequency, nausea, vomiting, diarrhea, or constipation.   PCP Dr Selena Batten  Past Medical History  Diagnosis Date  . Hypertension   . Hyperlipidemia   . Vitamin B12 deficiency   . Osteopenia   . DDD (degenerative disc disease), lumbar   . Type 2 diabetes mellitus   . Renal calculus, left   . Retained ureteral stent     RETAINED SINCE 2001  . Sigmoid diverticulosis   . Memory loss   . Full dentures    Past Surgical History  Procedure Laterality Date  . Cysto/  left retrograde pyelogram/  ureteral stent placement  06-10-1999  . Total abdominal hysterectomy with right  salpingoophorectomy/  moschcowitz procedure/  colpocystopexy and burch procedure  11-02-1999  . Cholecystectomy  2004  . Cataract extraction w/ intraocular lens  implant, bilateral  2000  . Appendectomy  1962  . Tonsillectomy  1970  . Tubal ligation  YRS AGO  . Ovarian cyst surgery    . Ureteroscopy Left 07/30/2014    Procedure: URETEROSCOPY;  Surgeon: Su Grand, MD;  Location: [facility];  Service: Urology;  Laterality: Left;  . Cystoscopy w/ ureteral stent removal Left 07/30/2014    Procedure: CYSTOSCOPY WITH FRAGMENTED STENT REMOVAL;  Surgeon: Su Grand, MD;  [facility];  Service: Urology;  Laterality: Left;   Family History  Problem Relation Age of Onset  . Diabetes Mother   . Glaucoma Mother   . Cancer Brother     type unknown  . Hypertension Father   . CVA Father   . Heart disease Mother    History  Substance Use Topics  . Smoking status: Never Smoker   . Smokeless tobacco: Never Used  . Alcohol Use: No   Lives at home Lives with son  OB History    No data available     Review of Systems  Constitutional: Positive for appetite change.  Gastrointestinal: Positive for abdominal pain. Negative for nausea, vomiting, diarrhea and constipation.  Genitourinary: Negative for dysuria, urgency and frequency.  All other systems reviewed and are negative.  Allergies  Lisinopril; Codeine; and Keflex  Home Medications   Prior  to Admission medications   Medication Sig Start Date End Date Taking? Authorizing Provider  amLODipine (NORVASC) 10 MG tablet Take 10 mg by mouth daily.   Yes Historical Provider, MD  aspirin 325 MG tablet Take 325 mg by mouth daily as needed for mild pain.   Yes Historical Provider, MD  donepezil (ARICEPT) 5 MG tablet Take 5 mg by mouth at bedtime.    Historical Provider, MD   Pulse 60  Temp(Src) 97.8 F (36.6 C) (Oral)  Resp 16  Ht  (1.651 m)  Wt 148 lb 12.8 oz (67.495 kg)  BMI 24.76 kg/m2  SpO2  98%  Vital signs normal   Physical Exam  Constitutional: She is oriented to person, place, and time. She appears well-developed and well-nourished.  Non-toxic appearance. She does not appear ill. No distress.  HENT:  Head: Normocephalic and atraumatic.  Right Ear: External ear normal.  Left Ear: External ear normal.  Nose: Nose normal. No mucosal edema or rhinorrhea.  Mouth/Throat: Oropharynx is clear and moist and mucous membranes are normal. No dental abscesses or uvula swelling.  Eyes: Conjunctivae and EOM are normal. Pupils are equal, round, and reactive to light.  Neck: Normal range of motion and full passive range of motion without pain. Neck supple.  Cardiovascular: Normal rate, regular rhythm and normal heart sounds.  Exam reveals no gallop and no friction rub.   No murmur heard. Pulmonary/Chest: Effort normal and breath sounds normal. No respiratory distress. She has no wheezes. She has no rhonchi. She has no rales. She exhibits no tenderness and no crepitus.  Abdominal: Soft. Normal appearance and bowel sounds are normal. She exhibits no distension. There is no tenderness. There is no rebound and no guarding.    Has mild suprapubic pain to palpation  Musculoskeletal: Normal range of motion. She exhibits no edema or tenderness.  Moves all extremities well.   Neurological: She is alert and oriented to person, place, and time. She has normal strength. No cranial nerve deficit.  Skin: Skin is warm, dry and intact. No rash noted. No erythema. No pallor.  Psychiatric: She has a normal mood and affect. Her speech is normal and behavior is normal. Her mood appears not anxious.  Nursing note and vitals reviewed.   ED Course  Procedures   Medications  sulfamethoxazole-trimethoprim (BACTRIM DS,SEPTRA DS) 800-160 MG per tablet 1 tablet (1 tablet Oral Given 10/18/14 0300)    DIAGNOSTIC STUDIES: Oxygen Saturation is 98% on RA, normal by my interpretation.    COORDINATION OF  CARE: 2:42 AM Discussed treatment plan with pt and family. Discussed abnormal urinalysis results with pt and plan to order and prescribe antibiotic course. Discussed return precautions including fever, vomiting, or worsening pain. Discrepancy  found in pt's account of cholecystectomy in 2004 and review of recent CT abdomen/pelvis scan in October 2015 that described pt's gallbladder and presence of cholelithiasis. Pt and family acknowledge and agree to plan. She acknowledges certain foods can upset her stomach. We talked about things to avoid and discussed if she gets pain in her right upper quadrant she should be studied evaluated.  Review of her prior records shows on May 10 she had the stent removed from her left ureter. He had been in place over 10 years and was in 3 parts. Patient also had a urine culture done in March 2014 that grew strep group D. Otherwise no other urine cultures were seen.  Labs Review Results for orders placed or performed during the hospital encounter  of 10/17/14  Lipase, blood  Result Value Ref Range   Lipase 20 (L) 22 - 51 U/L  Comprehensive metabolic panel  Result Value Ref Range   Sodium 142 135 - 145 mmol/L   Potassium 3.7 3.5 - 5.1 mmol/L   Chloride 111 101 - 111 mmol/L   CO2 22 22 - 32 mmol/L   Glucose, Bld 126 (H) 65 - 99 mg/dL   BUN 11 6 - 20 mg/dL   Creatinine, Ser 1.61 0.44 - 1.00 mg/dL   Calcium 9.1 8.9 - 09.6 mg/dL   Total Protein 8.0 6.5 - 8.1 g/dL   Albumin 4.4 3.5 - 5.0 g/dL   AST 18 15 - 41 U/L   ALT 10 (L) 14 - 54 U/L   Alkaline Phosphatase 73 38 - 126 U/L   Total Bilirubin 0.9 0.3 - 1.2 mg/dL   GFR calc non Af Amer 52 (L) >60 mL/min   GFR calc Af Amer 60 (L) >60 mL/min   Anion gap 9 5 - 15  CBC  Result Value Ref Range   WBC 7.3 4.0 - 10.5 K/uL   RBC 4.71 3.87 - 5.11 MIL/uL   Hemoglobin 13.0 12.0 - 15.0 g/dL   HCT 04.5 40.9 - 81.1 %   MCV 81.5 78.0 - 100.0 fL   MCH 27.6 26.0 - 34.0 pg   MCHC 33.9 30.0 - 36.0 g/dL   RDW 91.4 78.2 - 95.6  %   Platelets 142 (L) 150 - 400 K/uL  Urinalysis, Routine w reflex microscopic (not at [facility])  Result Value Ref Range   Color, Urine YELLOW YELLOW   APPearance CLOUDY (A) CLEAR   Specific Gravity, Urine 1.010 1.005 - 1.030   pH 6.5 5.0 - 8.0   Glucose, UA NEGATIVE NEGATIVE mg/dL   Hgb urine dipstick MODERATE (A) NEGATIVE   Bilirubin Urine NEGATIVE NEGATIVE   Ketones, ur NEGATIVE NEGATIVE mg/dL   Protein, ur NEGATIVE NEGATIVE mg/dL   Urobilinogen, UA 0.2 0.0 - 1.0 mg/dL   Nitrite NEGATIVE NEGATIVE   Leukocytes, UA LARGE (A) NEGATIVE  Urine microscopic-add on  Result Value Ref Range   Squamous Epithelial / LPF FEW (A) RARE   WBC, UA 21-50 <3 WBC/hpf   RBC / HPF 3-6 <3 RBC/hpf   Bacteria, UA FEW (A) RARE   Laboratory interpretation all normal except possible UTI   Imaging Review No results found.   EKG Interpretation None      MDM   Final diagnoses:  Urinary tract infection without hematuria, site unspecified    Discharge Medication List as of 10/18/2014  2:54 AM    START taking these medications   Details  sulfamethoxazole-trimethoprim (BACTRIM DS,SEPTRA DS) 800-160 MG per tablet Take 1 tablet by mouth 2 (two) times daily., Starting 10/18/2014, Until Discontinued, Print        Plan discharge  Kimberly Albe, MD, FACEP   I personally performed the services described in this documentation, which was scribed in my presence. The recorded information has been reviewed and considered.  Kimberly Albe, MD, Concha Pyo, MD 10/18/14 [phone]

## 2014-10-18 NOTE — Discharge Instructions (Signed)
Take the antibiotic until gone. Recheck if you get a fever, vomiting, pain in your flanks or you are feeling worse. Your CT scan done a few months ago shows you still have your gallbladder and you have gallstones. Avoid foods that make your stomach hurt.  Recheck if you get constant severe pain in your right upper abdomen or get vomiting.

## 2015-06-06 DIAGNOSIS — I10 Essential (primary) hypertension: Secondary | ICD-10-CM | POA: Diagnosis not present

## 2015-06-06 DIAGNOSIS — M549 Dorsalgia, unspecified: Secondary | ICD-10-CM | POA: Diagnosis not present

## 2015-06-13 ENCOUNTER — Emergency Department (HOSPITAL_COMMUNITY): Payer: Medicare Other

## 2015-06-13 ENCOUNTER — Encounter (HOSPITAL_COMMUNITY): Payer: Self-pay | Admitting: Emergency Medicine

## 2015-06-13 ENCOUNTER — Emergency Department (HOSPITAL_COMMUNITY)
Admission: EM | Admit: 2015-06-13 | Discharge: 2015-06-13 | Disposition: A | Discharge status: Home / Self Care | Payer: Medicare Other | Attending: Emergency Medicine | Admitting: Emergency Medicine

## 2015-06-13 DIAGNOSIS — M5431 Sciatica, right side: Secondary | ICD-10-CM | POA: Diagnosis not present

## 2015-06-13 DIAGNOSIS — Z87442 Personal history of urinary calculi: Secondary | ICD-10-CM | POA: Diagnosis not present

## 2015-06-13 DIAGNOSIS — N390 Urinary tract infection, site not specified: Secondary | ICD-10-CM | POA: Insufficient documentation

## 2015-06-13 DIAGNOSIS — Z9049 Acquired absence of other specified parts of digestive tract: Secondary | ICD-10-CM | POA: Insufficient documentation

## 2015-06-13 DIAGNOSIS — R109 Unspecified abdominal pain: Secondary | ICD-10-CM | POA: Diagnosis not present

## 2015-06-13 DIAGNOSIS — R1084 Generalized abdominal pain: Secondary | ICD-10-CM | POA: Diagnosis present

## 2015-06-13 DIAGNOSIS — M5441 Lumbago with sciatica, right side: Secondary | ICD-10-CM | POA: Diagnosis not present

## 2015-06-13 DIAGNOSIS — I10 Essential (primary) hypertension: Secondary | ICD-10-CM | POA: Insufficient documentation

## 2015-06-13 DIAGNOSIS — Z8739 Personal history of other diseases of the musculoskeletal system and connective tissue: Secondary | ICD-10-CM | POA: Diagnosis not present

## 2015-06-13 DIAGNOSIS — Z8719 Personal history of other diseases of the digestive system: Secondary | ICD-10-CM | POA: Insufficient documentation

## 2015-06-13 DIAGNOSIS — Z79899 Other long term (current) drug therapy: Secondary | ICD-10-CM | POA: Diagnosis not present

## 2015-06-13 DIAGNOSIS — E119 Type 2 diabetes mellitus without complications: Secondary | ICD-10-CM | POA: Diagnosis not present

## 2015-06-13 DIAGNOSIS — Z9851 Tubal ligation status: Secondary | ICD-10-CM | POA: Insufficient documentation

## 2015-06-13 DIAGNOSIS — M549 Dorsalgia, unspecified: Secondary | ICD-10-CM | POA: Diagnosis not present

## 2015-06-13 DIAGNOSIS — K760 Fatty (change of) liver, not elsewhere classified: Secondary | ICD-10-CM | POA: Diagnosis not present

## 2015-06-13 DIAGNOSIS — Z960 Presence of urogenital implants: Secondary | ICD-10-CM | POA: Diagnosis not present

## 2015-06-13 LAB — URINALYSIS, ROUTINE W REFLEX MICROSCOPIC
BILIRUBIN URINE: NEGATIVE
Glucose, UA: NEGATIVE mg/dL
HGB URINE DIPSTICK: NEGATIVE
KETONES UR: NEGATIVE mg/dL
Nitrite: NEGATIVE
PROTEIN: NEGATIVE mg/dL
Specific Gravity, Urine: 1.012 (ref 1.005–1.030)
pH: 5.5 (ref 5.0–8.0)

## 2015-06-13 LAB — I-STAT CG4 LACTIC ACID, ED: Lactic Acid, Venous: 1.5 mmol/L (ref 0.5–2.0)

## 2015-06-13 LAB — COMPREHENSIVE METABOLIC PANEL
ALT: 13 U/L — ABNORMAL LOW (ref 14–54)
AST: 30 U/L (ref 15–41)
Albumin: 4.6 g/dL (ref 3.5–5.0)
Alkaline Phosphatase: 66 U/L (ref 38–126)
Anion gap: 10 (ref 5–15)
BUN: 19 mg/dL (ref 6–20)
CALCIUM: 9.4 mg/dL (ref 8.9–10.3)
CO2: 24 mmol/L (ref 22–32)
Chloride: 106 mmol/L (ref 101–111)
Creatinine, Ser: 1.13 mg/dL — ABNORMAL HIGH (ref 0.44–1.00)
GFR calc non Af Amer: 44 mL/min — ABNORMAL LOW (ref >60)
GFR, EST AFRICAN AMERICAN: 51 mL/min — AB (ref >60)
GLUCOSE: 106 mg/dL — AB (ref 65–99)
Potassium: 3.8 mmol/L (ref 3.5–5.1)
SODIUM: 140 mmol/L (ref 135–145)
TOTAL PROTEIN: 8.4 g/dL — AB (ref 6.5–8.1)
Total Bilirubin: 0.9 mg/dL (ref 0.3–1.2)

## 2015-06-13 LAB — LIPASE, BLOOD: LIPASE: 30 U/L (ref 11–51)

## 2015-06-13 LAB — CBC
HCT: 38.6 % (ref 36.0–46.0)
Hemoglobin: 12.9 g/dL (ref 12.0–15.0)
MCH: 26.8 pg (ref 26.0–34.0)
MCHC: 33.4 g/dL (ref 30.0–36.0)
MCV: 80.2 fL (ref 78.0–100.0)
PLATELETS: 130 10*3/uL — AB (ref 150–400)
RBC: 4.81 MIL/uL (ref 3.87–5.11)
RDW: 14.4 % (ref 11.5–15.5)
WBC: 4.8 10*3/uL (ref 4.0–10.5)

## 2015-06-13 LAB — URINE MICROSCOPIC-ADD ON

## 2015-06-13 MED ORDER — IOHEXOL 300 MG/ML  SOLN
25.0000 mL | Freq: Once | INTRAMUSCULAR | Status: AC | PRN
  Administered 2015-06-13: 25 mL via ORAL

## 2015-06-13 MED ORDER — SULFAMETHOXAZOLE-TRIMETHOPRIM 800-160 MG PO TABS
1.0000 | ORAL_TABLET | Freq: Once | ORAL | Status: AC
  Administered 2015-06-13: 1 via ORAL
  Filled 2015-06-13: qty 1

## 2015-06-13 MED ORDER — HYDROCODONE-ACETAMINOPHEN 5-325 MG PO TABS: ORAL_TABLET | ORAL | Status: DC

## 2015-06-13 MED ORDER — IOPAMIDOL (ISOVUE-300) INJECTION 61%
100.0000 mL | Freq: Once | INTRAVENOUS | Status: AC | PRN
  Administered 2015-06-13: 100 mL via INTRAVENOUS

## 2015-06-13 MED ORDER — MORPHINE SULFATE (PF) 2 MG/ML IV SOLN
2.0000 mg | Freq: Once | INTRAVENOUS | Status: AC
  Administered 2015-06-13: 2 mg via INTRAVENOUS
  Filled 2015-06-13: qty 1

## 2015-06-13 MED ORDER — SULFAMETHOXAZOLE-TRIMETHOPRIM 800-160 MG PO TABS: 1.0000 | ORAL_TABLET | Freq: Two times a day (BID) | ORAL | Status: DC

## 2015-06-13 MED ORDER — ONDANSETRON HCL 4 MG/2ML IJ SOLN
4.0000 mg | Freq: Once | INTRAMUSCULAR | Status: AC
  Administered 2015-06-13: 4 mg via INTRAVENOUS
  Filled 2015-06-13: qty 2

## 2015-06-13 NOTE — ED Provider Notes (Signed)
CSN: [id number]     Arrival date & time 06/13/15  1140 History   First MD Initiated Contact with Patient 06/13/15 1557     Chief Complaint  Patient presents with  . Abdominal Pain     (Consider location/radiation/quality/duration/timing/severity/associated sxs/prior Treatment) HPI   Blood pressure 130/86, pulse 74, temperature 97.9 F (36.6 C), temperature source Oral, resp. rate 16, SpO2 98 %.  Storm FriskJoan E Swartzentruber is a 82 y.o. female with past medical history significant for hypertension, hyperlipidemia, complicated urologic history status post placement with stone encrusted stent that had to be surgically removed, all fragments were successfully removed by Dr. Brunilda PayorNesi in 2016 7 in by primary care Dr. Selena BattenKim for evaluation of abdominal pain. As per patient and her son she's had diffuse severe abdominal pain onset 3 days ago with decreased by mouth intake onset yesterday. Patient denies fever, chills, nausea, vomiting, change in bowel or bladder habits including melena, hematochezia, diarrhea, constipation, dysuria, hematuria, urinary frequency. States that the pain does not feel like prior kidney stone exacerbations. Patient also reports a low back pain which radiates down the left leg. She denies incontinence to both urine and stool. No pain medication taken prior to arrival.  Past Medical History  Diagnosis Date  . Hypertension   . Hyperlipidemia   . Vitamin B12 deficiency   . Osteopenia   . DDD (degenerative disc disease), lumbar   . Type 2 diabetes mellitus (HCC)   . Renal calculus, left   . Retained ureteral stent     RETAINED SINCE 2001  . Sigmoid diverticulosis   . Memory loss   . Full dentures    Past Surgical History  Procedure Laterality Date  . Cysto/  left retrograde pyelogram/  ureteral stent placement  06-10-1999  . Total abdominal hysterectomy with right salpingoophorectomy/  moschcowitz procedure/  colpocystopexy and burch procedure  11-02-1999  . Cholecystectomy  2004  .  Cataract extraction w/ intraocular lens  implant, bilateral  2000  . Appendectomy  1962  . Tonsillectomy  1970  . Tubal ligation  YRS AGO  . Ovarian cyst surgery    . Ureteroscopy Left 07/30/2014    Procedure: URETEROSCOPY;  Surgeon: Su GrandMarc Nesi, MD;  [facility];  Service: Urology;  Laterality: Left;  . Cystoscopy w/ ureteral stent removal Left 07/30/2014    Procedure: CYSTOSCOPY WITH FRAGMENTED STENT REMOVAL;  Surgeon: Su GrandMarc Nesi, MD;  [facility] ;  Service: Urology;  Laterality: Left;   Family History  Problem Relation Age of Onset  . Diabetes Mother   . Glaucoma Mother   . Cancer Brother     type unknown  . Hypertension Father   . CVA Father   . Heart disease Mother    Social History  Substance Use Topics  . Smoking status: Never Smoker   . Smokeless tobacco: Never Used  . Alcohol Use: No   OB History    No data available     Review of Systems  10 systems reviewed and found to be negative, except as noted in the HPI.  Allergies  Lisinopril; Codeine; and Keflex  Home Medications   Prior to Admission medications   Medication Sig Start Date End Date Taking? Authorizing Provider  gabapentin (NEURONTIN) 100 MG capsule Take 100 mg by mouth 2 (two) times daily.   Yes Historical Provider, MD  hydrALAZINE (APRESOLINE) 25 MG tablet Take 25 mg by mouth 2 (two) times daily.   Yes Historical Provider, MD  HYDROcodone-acetaminophen (  NORCO/VICODIN) 5-325 MG tablet Take 1 tablet every 6 hours as needed for pain 06/13/15   Joni Reining Lillian Tigges, PA-C  sulfamethoxazole-trimethoprim (BACTRIM DS) 800-160 MG tablet Take 1 tablet by mouth 2 (two) times daily. 06/13/15   Nichole Neyer, PA-C   BP 137/90 mmHg  Pulse 63  Temp(Src) 97.9 F (36.6 C) (Oral)  Resp 16  SpO2 96% Physical Exam  Constitutional: She appears well-developed and well-nourished. No distress.  HENT:  Head: Normocephalic.  Mouth/Throat: Oropharynx is clear and moist.   Eyes: Conjunctivae and EOM are normal. Pupils are equal, round, and reactive to light.  Neck: Normal range of motion.  Cardiovascular: Normal rate, regular rhythm and intact distal pulses.   Pulmonary/Chest: Effort normal and breath sounds normal. No stridor. No respiratory distress. She has no wheezes. She has no rales. She exhibits no tenderness.  Abdominal: Soft. Bowel sounds are normal. She exhibits no distension and no mass. There is tenderness. There is no rebound and no guarding.  Mild, diffuse tenderness to palpation with no guarding or rebound.  Murphy sign negative, no tenderness to palpation over McBurney's point, Rovsings, Psoas and obturator all negative.   Genitourinary:  No CVA tenderness to percussion bilaterally  Musculoskeletal: Normal range of motion. She exhibits no edema.  Neurological: She is alert.  No tenderness to percussion over lumbar spinous processes. Extensor hallux longus is 5 out of 5 bilaterally, straight leg raise is positive on the right side at approximately 30.  Psychiatric: She has a normal mood and affect.  Nursing note and vitals reviewed.   ED Course  Procedures (including critical care time) Labs Review Labs Reviewed  COMPREHENSIVE METABOLIC PANEL - Abnormal; Notable for the following:    Glucose, Bld 106 (*)    Creatinine, Ser 1.13 (*)    Total Protein 8.4 (*)    ALT 13 (*)    GFR calc non Af Amer 44 (*)    GFR calc Af Amer 51 (*)    All other components within normal limits  CBC - Abnormal; Notable for the following:    Platelets 130 (*)    All other components within normal limits  URINALYSIS, ROUTINE W REFLEX MICROSCOPIC (NOT AT [facility]) - Abnormal; Notable for the following:    APPearance CLOUDY (*)    Leukocytes, UA LARGE (*)    All other components within normal limits  URINE MICROSCOPIC-ADD ON - Abnormal; Notable for the following:    Squamous Epithelial / LPF 0-5 (*)    Bacteria, UA MANY (*)    Casts HYALINE CASTS (*)    All  other components within normal limits  URINE CULTURE  LIPASE, BLOOD  I-STAT CG4 LACTIC ACID, ED    Imaging Review Ct Abdomen Pelvis W Contrast  06/13/2015  CLINICAL DATA:  Right-sided abdominal pain for 3 days. EXAM: CT ABDOMEN AND PELVIS WITH CONTRAST TECHNIQUE: Multidetector CT imaging of the abdomen and pelvis was performed using the standard protocol following bolus administration of intravenous contrast. CONTRAST:  25mL OMNIPAQUE IOHEXOL 300 MG/ML SOLN, ISOVUE-300 IOPAMIDOL (ISOVUE-300) INJECTION 61% COMPARISON:  12/26/2013 FINDINGS: Atelectasis in the lung bases. Mild diffuse fatty infiltration of the liver. No focal lesions identified. Cholelithiasis with small stones in the dependent gallbladder. No gallbladder wall thickening. No bile duct dilatation. The pancreas, spleen, adrenal glands, abdominal aorta, inferior vena cava, and retroperitoneal lymph nodes are unremarkable. Left kidney demonstrates asymmetric atrophy or scarring. Mild prominence of intrarenal collecting system without ureterectasis. Large stone in the lower pole left kidney measuring  12 mm diameter. Cysts in the upper pole left kidney. Ureteral stent seen previously has been removed. Right kidney demonstrates mild scarring without hydronephrosis. The stomach, small bowel, and colon are not abnormally distended. Contrast material flows through to the colon without evidence of bowel obstruction. No free air or free fluid in the abdomen. Surgical clips along the anterior abdominal wall. Pelvis: The appendix is not identified. Scattered diverticula in the sigmoid colon without evidence of diverticulitis. Uterus is surgically absent. Funneling of the base the bladder suggesting pelvic floor laxity with small cystocele. Bladder wall is mildly thickened possibly indicating cystitis. Filling defects in the femoral veins, likely due to mixing of non-opacified blood. No free or loculated pelvic fluid collections. No pelvic mass or lymph  the feet. Degenerative changes in the spine. No destructive bone lesions. IMPRESSION: Fatty infiltration of the liver. Cholelithiasis. Left renal atrophy and scarring with prominent intrarenal collecting system. Nonobstructing stone in the lower pole left kidney. Funneling at the base of the bladder suggesting pelvic floor laxity with small cystocele. Bladder wall thickening suggesting cystitis. Electronically Signed   By: Burman Nieves M.D.   On: 06/13/2015 18:59   I have personally reviewed and evaluated these images and lab results as part of my medical decision-making.   EKG Interpretation None      MDM   Final diagnoses:  UTI (lower urinary tract infection)  Sciatica of right side    Filed Vitals:   06/13/15 1653 06/13/15 1658 06/13/15 1857 06/13/15 2017  BP: 160/102 161/108 164/94 137/90  Pulse:   67 63  Temp:   97.8 F (36.6 C) 97.9 F (36.6 C)  TempSrc:   Oral Oral  Resp:   16 16  SpO2:   99% 96%    Medications  morphine 2 MG/ML injection 2 mg (2 mg Intravenous Given 06/13/15 1702)  ondansetron (ZOFRAN) injection 4 mg (4 mg Intravenous Given 06/13/15 1702)  iohexol (OMNIPAQUE) 300 MG/ML solution 25 mL (25 mLs Oral Contrast Given 06/13/15 1730)  sulfamethoxazole-trimethoprim (BACTRIM DS,SEPTRA DS) 800-160 MG per tablet 1 tablet (1 tablet Oral Given 06/13/15 1901)  iopamidol (ISOVUE-300) 61 % injection 100 mL (100 mLs Intravenous Contrast Given 06/13/15 1844)    AMBREE FRANCES is 82 y.o. female presenting with Sciatica, abdominal pain with no GI or GU symptoms. Urinalysis is consistent with infection. Patient with compensated urologic history of kidney stones and stents CT abdomen pelvis pending. Patient will be started on Bactrim, urine culture pending. Patient reassessed after 2 mg of morphine and she states that she feels much improved. No significant abnormality seen on blood work. Bladder wall thickening to suggest cystitis.  This is a shared visit with the attending  physician who personally evaluated the patient and agrees with the care plan.   Evaluation does not show pathology that would require ongoing emergent intervention or inpatient treatment. Pt is hemodynamically stable and mentating appropriately. Discussed findings and plan with patient/guardian, who agrees with care plan. All questions answered. Return precautions discussed and outpatient follow up given.   Discharge Medication List as of 06/13/2015  8:06 PM    START taking these medications   Details  HYDROcodone-acetaminophen (NORCO/VICODIN) 5-325 MG tablet Take 1 tablet every 6 hours as needed for pain, Print             Wynetta Emery, PA-C 06/13/15 2155  Mancel Bale, MD 06/14/15 [phone]

## 2015-06-13 NOTE — ED Notes (Signed)
Called from lobby for labs. No response.

## 2015-06-13 NOTE — Discharge Instructions (Signed)
°  Take your antibiotics as directed and to completion. You should never have any leftover antibiotics! Push fluids and stay well hydrated.  ° °Please follow with your primary care doctor in the next 2 days for a check-up. They must obtain records for further management.  ° °Do not hesitate to return to the Emergency Department for any new, worsening or concerning symptoms.  ° °

## 2015-06-13 NOTE — ED Provider Notes (Signed)
  Face-to-face evaluation   History: She developed abdominal pain, right-sided, upper, 3 days ago. The pain is intermittent. She denies dysuria, urinary frequency, nausea or vomiting. She had a normal bowel movement yesterday.  Physical exam: Alert, elderly female who is moderately uncomfortable. She has right costovertebral angle tenderness to percussion. Lungs are clear. Heart regular without murmur. Abdomen is soft. There is mild right upper quadrant tenderness.  Initial impression- right kidney inflammation, likely UTI versus stone. Additional considerations include constipation, colitis and partial small bowel obstruction.  Medical screening examination/treatment/procedure(s) were conducted as a shared visit with non-physician practitioner(s) and myself.  I personally evaluated the patient during the encounter  Mancel BaleElliott Marianita Botkin, MD 06/14/15 [phone]

## 2015-06-13 NOTE — ED Notes (Signed)
Pt c/o right side abdominal pain onset 3 days ago, no n/v/diarrhea.

## 2015-06-15 LAB — URINE CULTURE

## 2015-06-20 DIAGNOSIS — N3090 Cystitis, unspecified without hematuria: Secondary | ICD-10-CM | POA: Diagnosis not present

## 2015-06-20 DIAGNOSIS — Z09 Encounter for follow-up examination after completed treatment for conditions other than malignant neoplasm: Secondary | ICD-10-CM | POA: Diagnosis not present

## 2015-06-20 DIAGNOSIS — I10 Essential (primary) hypertension: Secondary | ICD-10-CM | POA: Diagnosis not present

## 2015-07-03 ENCOUNTER — Observation Stay (HOSPITAL_COMMUNITY)
Admission: EM | Admit: 2015-07-03 | Discharge: 2015-07-05 | DRG: 682 | Disposition: A | Discharge status: Home Health | Payer: Medicare Other | Attending: Internal Medicine | Admitting: Internal Medicine

## 2015-07-03 ENCOUNTER — Encounter (HOSPITAL_COMMUNITY): Payer: Self-pay

## 2015-07-03 DIAGNOSIS — Z888 Allergy status to other drugs, medicaments and biological substances status: Secondary | ICD-10-CM

## 2015-07-03 DIAGNOSIS — Z9049 Acquired absence of other specified parts of digestive tract: Secondary | ICD-10-CM

## 2015-07-03 DIAGNOSIS — M8580 Other specified disorders of bone density and structure, unspecified site: Secondary | ICD-10-CM | POA: Diagnosis present

## 2015-07-03 DIAGNOSIS — Z83511 Family history of glaucoma: Secondary | ICD-10-CM

## 2015-07-03 DIAGNOSIS — R531 Weakness: Secondary | ICD-10-CM

## 2015-07-03 DIAGNOSIS — N19 Unspecified kidney failure: Secondary | ICD-10-CM

## 2015-07-03 DIAGNOSIS — Z9841 Cataract extraction status, right eye: Secondary | ICD-10-CM | POA: Diagnosis not present

## 2015-07-03 DIAGNOSIS — F0390 Unspecified dementia without behavioral disturbance: Secondary | ICD-10-CM | POA: Diagnosis not present

## 2015-07-03 DIAGNOSIS — R410 Disorientation, unspecified: Secondary | ICD-10-CM | POA: Diagnosis present

## 2015-07-03 DIAGNOSIS — Z87440 Personal history of urinary (tract) infections: Secondary | ICD-10-CM

## 2015-07-03 DIAGNOSIS — Z961 Presence of intraocular lens: Secondary | ICD-10-CM | POA: Diagnosis not present

## 2015-07-03 DIAGNOSIS — Z87442 Personal history of urinary calculi: Secondary | ICD-10-CM

## 2015-07-03 DIAGNOSIS — M6281 Muscle weakness (generalized): Secondary | ICD-10-CM | POA: Diagnosis not present

## 2015-07-03 DIAGNOSIS — I10 Essential (primary) hypertension: Secondary | ICD-10-CM | POA: Diagnosis not present

## 2015-07-03 DIAGNOSIS — Z9842 Cataract extraction status, left eye: Secondary | ICD-10-CM

## 2015-07-03 DIAGNOSIS — Z90710 Acquired absence of both cervix and uterus: Secondary | ICD-10-CM | POA: Diagnosis not present

## 2015-07-03 DIAGNOSIS — Z881 Allergy status to other antibiotic agents status: Secondary | ICD-10-CM | POA: Diagnosis not present

## 2015-07-03 DIAGNOSIS — N170 Acute kidney failure with tubular necrosis: Secondary | ICD-10-CM | POA: Insufficient documentation

## 2015-07-03 DIAGNOSIS — R627 Adult failure to thrive: Secondary | ICD-10-CM | POA: Diagnosis not present

## 2015-07-03 DIAGNOSIS — Z885 Allergy status to narcotic agent status: Secondary | ICD-10-CM

## 2015-07-03 DIAGNOSIS — Z79899 Other long term (current) drug therapy: Secondary | ICD-10-CM

## 2015-07-03 DIAGNOSIS — E785 Hyperlipidemia, unspecified: Secondary | ICD-10-CM | POA: Diagnosis not present

## 2015-07-03 DIAGNOSIS — Z8249 Family history of ischemic heart disease and other diseases of the circulatory system: Secondary | ICD-10-CM

## 2015-07-03 DIAGNOSIS — G9340 Encephalopathy, unspecified: Secondary | ICD-10-CM

## 2015-07-03 DIAGNOSIS — E860 Dehydration: Secondary | ICD-10-CM | POA: Diagnosis present

## 2015-07-03 DIAGNOSIS — E875 Hyperkalemia: Secondary | ICD-10-CM | POA: Diagnosis not present

## 2015-07-03 DIAGNOSIS — Z882 Allergy status to sulfonamides status: Secondary | ICD-10-CM

## 2015-07-03 DIAGNOSIS — K5730 Diverticulosis of large intestine without perforation or abscess without bleeding: Secondary | ICD-10-CM | POA: Diagnosis not present

## 2015-07-03 DIAGNOSIS — Z823 Family history of stroke: Secondary | ICD-10-CM

## 2015-07-03 DIAGNOSIS — E119 Type 2 diabetes mellitus without complications: Secondary | ICD-10-CM | POA: Diagnosis not present

## 2015-07-03 DIAGNOSIS — Z833 Family history of diabetes mellitus: Secondary | ICD-10-CM | POA: Diagnosis not present

## 2015-07-03 DIAGNOSIS — N179 Acute kidney failure, unspecified: Principal | ICD-10-CM | POA: Diagnosis present

## 2015-07-03 LAB — CBC WITH DIFFERENTIAL/PLATELET
Basophils Absolute: 0 10*3/uL (ref 0.0–0.1)
Basophils Relative: 0 %
EOS PCT: 0 %
Eosinophils Absolute: 0 10*3/uL (ref 0.0–0.7)
HCT: 41.3 % (ref 36.0–46.0)
Hemoglobin: 14.6 g/dL (ref 12.0–15.0)
LYMPHS ABS: 1.2 10*3/uL (ref 0.7–4.0)
LYMPHS PCT: 26 %
MCH: 27.7 pg (ref 26.0–34.0)
MCHC: 35.4 g/dL (ref 30.0–36.0)
MCV: 78.4 fL (ref 78.0–100.0)
MONO ABS: 0.2 10*3/uL (ref 0.1–1.0)
Monocytes Relative: 4 %
Neutro Abs: 3.3 10*3/uL (ref 1.7–7.7)
Neutrophils Relative %: 70 %
PLATELETS: 127 10*3/uL — AB (ref 150–400)
RBC: 5.27 MIL/uL — AB (ref 3.87–5.11)
RDW: 14.6 % (ref 11.5–15.5)
WBC: 4.8 10*3/uL (ref 4.0–10.5)

## 2015-07-03 LAB — BASIC METABOLIC PANEL
ANION GAP: 11 (ref 5–15)
BUN: 61 mg/dL — ABNORMAL HIGH (ref 6–20)
CALCIUM: 8.9 mg/dL (ref 8.9–10.3)
CO2: 20 mmol/L — ABNORMAL LOW (ref 22–32)
CREATININE: 2.09 mg/dL — AB (ref 0.44–1.00)
Chloride: 109 mmol/L (ref 101–111)
GFR, EST AFRICAN AMERICAN: 24 mL/min — AB (ref >60)
GFR, EST NON AFRICAN AMERICAN: 21 mL/min — AB (ref >60)
Glucose, Bld: 116 mg/dL — ABNORMAL HIGH (ref 65–99)
Potassium: 4.8 mmol/L (ref 3.5–5.1)
SODIUM: 140 mmol/L (ref 135–145)

## 2015-07-03 LAB — URINALYSIS, ROUTINE W REFLEX MICROSCOPIC
GLUCOSE, UA: NEGATIVE mg/dL
HGB URINE DIPSTICK: NEGATIVE
KETONES UR: NEGATIVE mg/dL
Leukocytes, UA: NEGATIVE
NITRITE: NEGATIVE
PH: 5.5 (ref 5.0–8.0)
Protein, ur: NEGATIVE mg/dL
Specific Gravity, Urine: 1.02 (ref 1.005–1.030)

## 2015-07-03 LAB — COMPREHENSIVE METABOLIC PANEL
ALT: 10 U/L — AB (ref 14–54)
AST: 20 U/L (ref 15–41)
Albumin: 5.6 g/dL — ABNORMAL HIGH (ref 3.5–5.0)
Alkaline Phosphatase: 70 U/L (ref 38–126)
Anion gap: 12 (ref 5–15)
BILIRUBIN TOTAL: 0.6 mg/dL (ref 0.3–1.2)
BUN: 81 mg/dL — ABNORMAL HIGH (ref 6–20)
CHLORIDE: 108 mmol/L (ref 101–111)
CO2: 18 mmol/L — ABNORMAL LOW (ref 22–32)
CREATININE: 2.86 mg/dL — AB (ref 0.44–1.00)
Calcium: 9.9 mg/dL (ref 8.9–10.3)
GFR, EST AFRICAN AMERICAN: 16 mL/min — AB (ref >60)
GFR, EST NON AFRICAN AMERICAN: 14 mL/min — AB (ref >60)
Glucose, Bld: 109 mg/dL — ABNORMAL HIGH (ref 65–99)
Potassium: 6.2 mmol/L (ref 3.5–5.1)
Sodium: 138 mmol/L (ref 135–145)
TOTAL PROTEIN: 9.1 g/dL — AB (ref 6.5–8.1)

## 2015-07-03 LAB — I-STAT CG4 LACTIC ACID, ED: Lactic Acid, Venous: 1.4 mmol/L (ref 0.5–2.0)

## 2015-07-03 LAB — CALCIUM: CALCIUM: 8.9 mg/dL (ref 8.9–10.3)

## 2015-07-03 LAB — MAGNESIUM
MAGNESIUM: 2.5 mg/dL — AB (ref 1.7–2.4)
Magnesium: 2.9 mg/dL — ABNORMAL HIGH (ref 1.7–2.4)

## 2015-07-03 LAB — CK: CK TOTAL: 104 U/L (ref 38–234)

## 2015-07-03 LAB — TSH: TSH: 2.795 u[IU]/mL (ref 0.350–4.500)

## 2015-07-03 MED ORDER — DEXTROSE-NACL 5-0.45 % IV SOLN
INTRAVENOUS | Status: DC
  Administered 2015-07-03 – 2015-07-04 (×2): via INTRAVENOUS

## 2015-07-03 MED ORDER — DEXTROSE 50 % IV SOLN
50.0000 mL | Freq: Once | INTRAVENOUS | Status: AC
  Administered 2015-07-03: 50 mL via INTRAVENOUS
  Filled 2015-07-03: qty 50

## 2015-07-03 MED ORDER — SODIUM POLYSTYRENE SULFONATE 15 GM/60ML PO SUSP
30.0000 g | Freq: Once | ORAL | Status: AC
  Administered 2015-07-03: 30 g via ORAL
  Filled 2015-07-03: qty 120

## 2015-07-03 MED ORDER — POLYETHYLENE GLYCOL 3350 17 G PO PACK
17.0000 g | PACK | Freq: Every day | ORAL | Status: DC | PRN
Start: 2015-07-03 — End: 2015-07-05

## 2015-07-03 MED ORDER — SODIUM CHLORIDE 0.9 % IV BOLUS (SEPSIS)
1000.0000 mL | Freq: Once | INTRAVENOUS | Status: AC
  Administered 2015-07-03: 1000 mL via INTRAVENOUS

## 2015-07-03 MED ORDER — SODIUM BICARBONATE 8.4 % IV SOLN
50.0000 meq | Freq: Once | INTRAVENOUS | Status: AC
  Administered 2015-07-03: 50 meq via INTRAVENOUS
  Filled 2015-07-03: qty 50

## 2015-07-03 MED ORDER — SODIUM CHLORIDE 0.9% FLUSH
3.0000 mL | Freq: Two times a day (BID) | INTRAVENOUS | Status: DC
  Administered 2015-07-03 – 2015-07-04 (×2): 3 mL via INTRAVENOUS

## 2015-07-03 MED ORDER — SODIUM CHLORIDE 0.9 % IV SOLN: INTRAVENOUS | Status: DC

## 2015-07-03 MED ORDER — DEXTROSE 50 % IV SOLN
INTRAVENOUS | Status: AC
  Filled 2015-07-03: qty 50

## 2015-07-03 MED ORDER — ONDANSETRON HCL 4 MG/2ML IJ SOLN: 4.0000 mg | Freq: Four times a day (QID) | INTRAMUSCULAR | Status: DC | PRN

## 2015-07-03 MED ORDER — ONDANSETRON HCL 4 MG PO TABS
4.0000 mg | ORAL_TABLET | Freq: Four times a day (QID) | ORAL | Status: DC | PRN
Start: 2015-07-03 — End: 2015-07-05

## 2015-07-03 MED ORDER — HEPARIN SODIUM (PORCINE) 5000 UNIT/ML IJ SOLN
5000.0000 [IU] | Freq: Three times a day (TID) | INTRAMUSCULAR | Status: DC
  Administered 2015-07-03: 5000 [IU] via SUBCUTANEOUS
  Filled 2015-07-03 (×3): qty 1

## 2015-07-03 MED ORDER — INSULIN ASPART 100 UNIT/ML ~~LOC~~ SOLN
5.0000 [IU] | Freq: Once | SUBCUTANEOUS | Status: AC
  Administered 2015-07-03: 5 [IU] via INTRAVENOUS
  Filled 2015-07-03: qty 1

## 2015-07-03 NOTE — Progress Notes (Signed)
Patient wanted to think about foley placement, she is aware of the Strict I&O order and the purpose of the foley.  So far she has been continent and I have been able to measure her output. Will continue to monitor.  Ernesta AmbleSusie Lewis, RN

## 2015-07-03 NOTE — H&P (Signed)
[facility] History and Physical  [name] UEA:[id number] DOB: 18-Feb-1930 DOA: 07/03/2015  Referring physician: Dr. Clydene Pugh PCP: Pearson Grippe, MD   Chief Complaint: confusion  HPI: [name] is a 82 y.o. female past medical history of hypertension hyperlipidemia and complicated urological history followed by Dr. Brunilda Payor status post stent placement and removal back in December of thousand 16 the recently came to the ED about 2 weeks prior to admission was treated for UTI and started on Bactrim. Most of the history is obtained from the chart as the family is not present and the patient is encephalopathic, as per chart the patient has been getting weak for the last several days and confused to the point where this morning she couldn't get get up from bed.  In the ED: Roseanne Reno to be hyperkalemic insulin glucose and bicarbonate were given and Kayexalate. So we are consulted for further evaluation.   Review of Systems:  Constitutional:  No weight loss, night sweats, Fevers, chills, fatigue.  HEENT:  No headaches, Difficulty swallowing,Tooth/dental problems,Sore throat,  No sneezing, itching, ear ache, nasal congestion, post nasal drip,  Cardio-vascular:  No chest pain, Orthopnea, PND, swelling in lower extremities, anasarca, dizziness, palpitations  GI:  No heartburn, indigestion, abdominal pain, nausea, vomiting, diarrhea, change in bowel habits, loss of appetite  Resp:  No shortness of breath with exertion or at rest. No excess mucus, no productive cough, No non-productive cough, No coughing up of blood.No change in color of mucus.No wheezing.No chest wall deformity  Skin:  no rash or lesions.  GU:  no dysuria, change in color of urine, no urgency or frequency. No flank pain.  Musculoskeletal:  No joint pain or swelling. No decreased range of motion. No back pain.  Psych:  No change in mood or affect. No depression or anxiety. No memory loss.   Past Medical History  Diagnosis  Date  . Hypertension   . Hyperlipidemia   . Vitamin B12 deficiency   . Osteopenia   . DDD (degenerative disc disease), lumbar   . Type 2 diabetes mellitus (HCC)   . Renal calculus, left   . Retained ureteral stent     RETAINED SINCE 2001  . Sigmoid diverticulosis   . Memory loss   . Full dentures    Past Surgical History  Procedure Laterality Date  . Cysto/  left retrograde pyelogram/  ureteral stent placement  06-10-1999  . Total abdominal hysterectomy with right salpingoophorectomy/  moschcowitz procedure/  colpocystopexy and burch procedure  11-02-1999  . Cholecystectomy  2004  . Cataract extraction w/ intraocular lens  implant, bilateral  2000  . Appendectomy  1962  . Tonsillectomy  1970  . Tubal ligation  YRS AGO  . Ovarian cyst surgery    . Ureteroscopy Left 07/30/2014    Procedure: URETEROSCOPY;  Surgeon: Su Grand, MD;  Location: [facility];  Service: Urology;  Laterality: Left;  . Cystoscopy w/ ureteral stent removal Left 07/30/2014    Procedure: CYSTOSCOPY WITH FRAGMENTED STENT REMOVAL;  Surgeon: Su Grand, MD;  Location: [facility];  Service: Urology;  Laterality: Left;   Social History:  reports that she has never smoked. She has never used smokeless tobacco. She reports that she does not drink alcohol or use illicit drugs.  Allergies  Allergen Reactions  . Lisinopril Anaphylaxis  . Bactrim [Sulfamethoxazole-Trimethoprim] Other (See Comments)    AKI  . Codeine Nausea And Vomiting  . Keflex [Cephalexin] Swelling    Angioedema  Family History  Problem Relation Age of Onset  . Diabetes Mother   . Glaucoma Mother   . Cancer Brother     type unknown  . Hypertension Father   . CVA Father   . Heart disease Mother     Prior to Admission medications   Medication Sig Start Date End Date Taking? Authorizing Provider  Aspirin-Salicylamide-Caffeine (BC HEADACHE POWDER PO) Take 1 packet by mouth every 8 (eight) hours as needed  (headache).   Yes Historical Provider, MD  gabapentin (NEURONTIN) 100 MG capsule Take 100 mg by mouth 2 (two) times daily.   Yes Historical Provider, MD  hydrALAZINE (APRESOLINE) 25 MG tablet Take 25 mg by mouth 2 (two) times daily.   Yes Historical Provider, MD  HYDROcodone-acetaminophen (NORCO/VICODIN) 5-325 MG tablet Take 1 tablet every 6 hours as needed for pain 06/13/15  Yes Nicole Pisciotta, PA-C  sulfamethoxazole-trimethoprim (BACTRIM DS) 800-160 MG tablet Take 1 tablet by mouth 2 (two) times daily. Patient not taking: Reported on 07/03/2015 06/13/15   Wynetta EmeryNicole Pisciotta, PA-C   Physical Exam: Filed Vitals:   07/03/15 1106 07/03/15 1200  BP: 137/95 136/98  Pulse: 74 67  Temp: 97.4 F (36.3 C)   TempSrc: Oral   Resp:  8  SpO2: 98% 97%    Wt Readings from Last 3 Encounters:  10/17/14 67.495 kg (148 lb 12.8 oz)  07/30/14 67.813 kg (149 lb 8 oz)  02/13/14 65.091 kg (143 lb 8 oz)    General:  Appears calm and comfortable, but confused Eyes: PERRL, normal lids, irises & conjunctiva ENT: grossly normal hearing, lips & tongue Neck: no LAD, masses or thyromegaly Cardiovascular: RRR, no rubs no lower edema. Telemetry: SR, no arrhythmias  Respiratory: CTA bilaterally, no w/r/r. Normal respiratory effort. Abdomen: soft, ntnd Skin: no rash or induration seen on limited exam Musculoskeletal: grossly normal tone BUE/BLE Psychiatric: grossly normal mood and affect, speech fluent and appropriate Neurologic: All 470s without any difficulties.           Labs on Admission:  Basic Metabolic Panel:  Recent Labs Lab 07/03/15 1126  NA 138  K 6.2*  CL 108  CO2 18*  GLUCOSE 109*  BUN 81*  CREATININE 2.86*  CALCIUM 9.9  MG 2.9*   Liver Function Tests:  Recent Labs Lab 07/03/15 1126  AST 20  ALT 10*  ALKPHOS 70  BILITOT 0.6  PROT 9.1*  ALBUMIN 5.6*   No results for input(s): LIPASE, AMYLASE in the last 168 hours. No results for input(s): AMMONIA in the last 168  hours. CBC:  Recent Labs Lab 07/03/15 1126  WBC 4.8  NEUTROABS 3.3  HGB 14.6  HCT 41.3  MCV 78.4  PLT 127*   Cardiac Enzymes:  Recent Labs Lab 07/03/15 1126  CKTOTAL 104    BNP (last 3 results) No results for input(s): BNP in the last 8760 hours.  ProBNP (last 3 results) No results for input(s): PROBNP in the last 8760 hours.  CBG: No results for input(s): GLUCAP in the last 168 hours.  Radiological Exams on Admission: No results found.  EKG: Independently reviewed. Pending at time of the dictation  Assessment/Plan AKI (acute kidney injury) (HCC)/ Hyperkalemia: This probably Bactrim induced, will hold all her antihypertensive medication. We'll start on aggressive IV fluid hydration recheck a basic metabolic panel in the morning. I did not appreciate a rub, so there is no need for emergent dialysis. The ED has given her IV insulin glucose bicarbonate and Kayexalate. So this should help  with her mild hyperkalemia. EKG is pending at the time of this dictation. Insert a Foley posterior cousin O's.  Acute encephalopathy: Likely multifactorial I did not appreciate a rub she does not have asterixis on physical exam. So is unlikely uremia but it could be due to accumulation of her Neurontin and narcotics. We'll treat her conservatively and reevaluate in the morning. I will go ahead and hold her hydralazine: Continue IV fluids.   Code Status: full DVT Prophylaxis:heparin Family Communication: none, try to call her son without was unsuccessful sono was [phone] Disposition Plan: inpatient  Time spent: 65 min  Marinda Elk [facility] Pager [phone]

## 2015-07-03 NOTE — ED Notes (Signed)
Bed: WA20 Expected date:  Expected time:  Means of arrival:  Comments: 

## 2015-07-03 NOTE — ED Notes (Signed)
Per EMS, Pt, from home, c/o increasing weakness, headache, and generalized abdominal pain x 2 weeks.  Denies n/v/d.  NAD noted.

## 2015-07-03 NOTE — ED Provider Notes (Signed)
CSN: [id number]     Arrival date & time 07/03/15  1040 History   First MD Initiated Contact with Patient 07/03/15 1053     No chief complaint on file.    (Consider location/radiation/quality/duration/timing/severity/associated sxs/prior Treatment) Patient is a 82 y.o. female presenting with weakness. The history is provided by the patient and a relative.  Weakness This is a new problem. The current episode started more than 1 week ago. The problem occurs constantly. The problem has been gradually worsening. Associated symptoms include headaches (occasional). Pertinent negatives include no chest pain, no abdominal pain and no shortness of breath. Nothing aggravates the symptoms. Nothing relieves the symptoms. She has tried nothing for the symptoms.    Past Medical History  Diagnosis Date  . Hypertension   . Hyperlipidemia   . Vitamin B12 deficiency   . Osteopenia   . DDD (degenerative disc disease), lumbar   . Type 2 diabetes mellitus (HCC)   . Renal calculus, left   . Retained ureteral stent     RETAINED SINCE 2001  . Sigmoid diverticulosis   . Memory loss   . Full dentures    Past Surgical History  Procedure Laterality Date  . Cysto/  left retrograde pyelogram/  ureteral stent placement  06-10-1999  . Total abdominal hysterectomy with right salpingoophorectomy/  moschcowitz procedure/  colpocystopexy and burch procedure  11-02-1999  . Cholecystectomy  2004  . Cataract extraction w/ intraocular lens  implant, bilateral  2000  . Appendectomy  1962  . Tonsillectomy  1970  . Tubal ligation  YRS AGO  . Ovarian cyst surgery    . Ureteroscopy Left 07/30/2014    Procedure: URETEROSCOPY;  Surgeon: Su Grand, MD;  Location: [facility];  Service: Urology;  Laterality: Left;  . Cystoscopy w/ ureteral stent removal Left 07/30/2014    Procedure: CYSTOSCOPY WITH FRAGMENTED STENT REMOVAL;  Surgeon: Su Grand, MD;  Location: [facility];  Service: Urology;   Laterality: Left;   Family History  Problem Relation Age of Onset  . Diabetes Mother   . Glaucoma Mother   . Cancer Brother     type unknown  . Hypertension Father   . CVA Father   . Heart disease Mother    Social History  Substance Use Topics  . Smoking status: Never Smoker   . Smokeless tobacco: Never Used  . Alcohol Use: No   OB History    No data available     Review of Systems  Constitutional: Positive for activity change (hasn't left bedroom in roughly a week), appetite change and fatigue. Negative for fever.  Respiratory: Negative for shortness of breath.   Cardiovascular: Negative for chest pain.  Gastrointestinal: Negative for abdominal pain.  Neurological: Positive for weakness and headaches (occasional).  All other systems reviewed and are negative.     Allergies  Lisinopril; Codeine; and Keflex  Home Medications   Prior to Admission medications   Medication Sig Start Date End Date Taking? Authorizing Provider  gabapentin (NEURONTIN) 100 MG capsule Take 100 mg by mouth 2 (two) times daily.    Historical Provider, MD  hydrALAZINE (APRESOLINE) 25 MG tablet Take 25 mg by mouth 2 (two) times daily.    Historical Provider, MD  HYDROcodone-acetaminophen (NORCO/VICODIN) 5-325 MG tablet Take 1 tablet every 6 hours as needed for pain 06/13/15   Joni Reining Pisciotta, PA-C  sulfamethoxazole-trimethoprim (BACTRIM DS) 800-160 MG tablet Take 1 tablet by mouth 2 (two) times daily. 06/13/15   Wynetta Emery, PA-C  There were no vitals taken for this visit. Physical Exam  Constitutional: She is oriented to person, place, and time. She appears well-developed and well-nourished. She appears listless. No distress.  HENT:  Head: Normocephalic.  Eyes: Conjunctivae are normal.  Neck: Neck supple. No tracheal deviation present.  Cardiovascular: Normal rate, regular rhythm and normal heart sounds.   Pulmonary/Chest: Effort normal and breath sounds normal. No respiratory distress.  She has no wheezes. She has no rales.  Abdominal: Soft. She exhibits no distension. There is no tenderness.  Neurological: She is oriented to person, place, and time. She appears listless.  Skin: Skin is warm and dry.  Psychiatric: Her speech is delayed. She is slowed.    ED Course  Procedures (including critical care time)  CRITICAL CARE Performed by: Lyndal PulleyKnott, Dakwon Wenberg Total critical care time: 30 minutes Critical care time was exclusive of separately billable procedures and treating other patients. Critical care was necessary to treat or prevent imminent or life-threatening deterioration. Critical care was time spent personally by me on the following activities: development of treatment plan with patient and/or surrogate as well as nursing, discussions with consultants, evaluation of patient's response to treatment, examination of patient, obtaining history from patient or surrogate, ordering and performing treatments and interventions, ordering and review of laboratory studies, ordering and review of radiographic studies, pulse oximetry and re-evaluation of patient's condition.   Labs Review Labs Reviewed  CBC WITH DIFFERENTIAL/PLATELET - Abnormal; Notable for the following:    RBC 5.27 (*)    Platelets 127 (*)    All other components within normal limits  COMPREHENSIVE METABOLIC PANEL - Abnormal; Notable for the following:    Potassium 6.2 (*)    CO2 18 (*)    Glucose, Bld 109 (*)    BUN 81 (*)    Creatinine, Ser 2.86 (*)    Total Protein 9.1 (*)    Albumin 5.6 (*)    ALT 10 (*)    GFR calc non Af Amer 14 (*)    GFR calc Af Amer 16 (*)    All other components within normal limits  URINALYSIS, ROUTINE W REFLEX MICROSCOPIC (NOT AT [facility]) - Abnormal; Notable for the following:    Bilirubin Urine SMALL (*)    All other components within normal limits  MAGNESIUM - Abnormal; Notable for the following:    Magnesium 2.9 (*)    All other components within normal limits  URINE CULTURE   CK  TSH  I-STAT CG4 LACTIC ACID, ED    Imaging Review No results found. I have personally reviewed and evaluated these images and lab results as part of my medical decision-making.   EKG Interpretation   Date/Time:  Thursday July 03 2015 13:51:41 EDT Ventricular Rate:  60 PR Interval:  182 QRS Duration: 100 QT Interval:  423 QTC Calculation: 423 R Axis:   -33 Text Interpretation:  Sinus rhythm Left ventricular hypertrophy Anterior Q  waves, possibly due to LVH Minimal ST elevation, lateral leads Baseline  wander in lead(s) V3 since last tracing T waves more prominent Otherwise  no significant change Confirmed by Kariel Skillman MD, Jahid Weida ([id number]) on 07/03/2015  2:17:07 PM      MDM   Final diagnoses:  AKI (acute kidney injury) (HCC)  Generalized weakness  Hyperkalemia  Uremia    82 y.o. female presents with weakness, malaise. Had bactrim for 2 weeks, finished 1 week ago. Has been having symptoms for a little more than a week. Barely getting out of bed over  this period of time.  No evidence of persistent infection in urine. She remains very weak. Labs are consistent with a prerenal azotemia, significant uremia to 81 and baseline creatinine of 1 is now 2.86. She appears to have some metabolic acidosis likely related to her kidney injury. She has hyperkalemia to 6.2 and was treated with bicarbonate and insulin/D50 pending IV rehydration to correct the underlying cause. Hospitalist was consulted for admission and will see the patient in the emergency department.    Lyndal Pulley, MD 07/03/15 [phone]

## 2015-07-03 NOTE — ED Notes (Signed)
Potassium 6.2  Notified primary nurse

## 2015-07-04 DIAGNOSIS — N179 Acute kidney failure, unspecified: Secondary | ICD-10-CM | POA: Diagnosis not present

## 2015-07-04 DIAGNOSIS — E875 Hyperkalemia: Secondary | ICD-10-CM

## 2015-07-04 DIAGNOSIS — G9340 Encephalopathy, unspecified: Secondary | ICD-10-CM | POA: Diagnosis not present

## 2015-07-04 LAB — URINE CULTURE

## 2015-07-04 LAB — CBC
HEMATOCRIT: 34.6 % — AB (ref 36.0–46.0)
HEMOGLOBIN: 12 g/dL (ref 12.0–15.0)
MCH: 27.1 pg (ref 26.0–34.0)
MCHC: 34.7 g/dL (ref 30.0–36.0)
MCV: 78.1 fL (ref 78.0–100.0)
Platelets: 88 10*3/uL — ABNORMAL LOW (ref 150–400)
RBC: 4.43 MIL/uL (ref 3.87–5.11)
RDW: 14.6 % (ref 11.5–15.5)
WBC: 3.6 10*3/uL — ABNORMAL LOW (ref 4.0–10.5)

## 2015-07-04 LAB — COMPREHENSIVE METABOLIC PANEL
ALK PHOS: 54 U/L (ref 38–126)
ALT: 10 U/L — ABNORMAL LOW (ref 14–54)
ANION GAP: 12 (ref 5–15)
AST: 20 U/L (ref 15–41)
Albumin: 4.2 g/dL (ref 3.5–5.0)
BILIRUBIN TOTAL: 0.9 mg/dL (ref 0.3–1.2)
BUN: 45 mg/dL — ABNORMAL HIGH (ref 6–20)
CALCIUM: 9 mg/dL (ref 8.9–10.3)
CO2: 20 mmol/L — ABNORMAL LOW (ref 22–32)
Chloride: 110 mmol/L (ref 101–111)
Creatinine, Ser: 1.56 mg/dL — ABNORMAL HIGH (ref 0.44–1.00)
GFR calc non Af Amer: 30 mL/min — ABNORMAL LOW (ref >60)
GFR, EST AFRICAN AMERICAN: 34 mL/min — AB (ref >60)
Glucose, Bld: 127 mg/dL — ABNORMAL HIGH (ref 65–99)
Potassium: 4.3 mmol/L (ref 3.5–5.1)
Sodium: 142 mmol/L (ref 135–145)
TOTAL PROTEIN: 7.1 g/dL (ref 6.5–8.1)

## 2015-07-04 MED ORDER — SODIUM CHLORIDE 0.9 % IV SOLN
INTRAVENOUS | Status: DC
  Administered 2015-07-04 – 2015-07-05 (×2): via INTRAVENOUS

## 2015-07-04 MED ORDER — LORAZEPAM 2 MG/ML IJ SOLN
0.5000 mg | Freq: Once | INTRAMUSCULAR | Status: AC
  Administered 2015-07-04: 0.5 mg via INTRAVENOUS
  Filled 2015-07-04: qty 1

## 2015-07-04 NOTE — Progress Notes (Signed)
[facility] PROGRESS NOTE  Storm FriskJoan E Dosh ZOX:[id number] DOB: 1929/11/13 DOA: 07/03/2015 PCP: Pearson GrippeJames Kim, MD  Assessment/Plan: 1. Functional decline/failure to thrive. -[name] is an 82 year old female with history of dementia presented with complaints of generalized weakness, minimal by mouth intake, not functioning at her usual baseline. -Lab work performed in the emergency department revealed acute kidney injury having creatinine of 2.86. This responded to IV fluid challenge. -Suspect psychotropic medications may have contributed as well. -On the following morning she seems to be showing clinical improvement. She was emulated down the hallway and back as her diet was advanced.  2.  Hyperkalemia. -Initial lab work revealed a potassium of 6.2, improved to 4.3 on this mornings lab work. Given IV insulin and convexly in the emergency department. -Suspect hyperkalemia could've been related to acute kidney injury.  3.  Dehydration. -She present with elevated creatinine responded to IV fluid hydration. I wonder about the possibility of psychotropic medications precipitating excessive daytime sleepiness leading to decreased by mouth intake  4.  History of dementia. -Her son reporting that she has not received formal diagnosis of dementia however states that she has "memory loss" -Will obtain a formal physical consult  Code Status: Full code Family Communication: I spoke with her son Molly MaduroShawn Mccamish over telephone  Disposition Plan: Continue gentle IV fluid hydration   Consultants:  PT  Antibiotics:    HPI/Subjective: [name] is a pleasant 82 year old female with a past medical history of advanced dementia currently resides in the community with family. He was admitted to the medicine service on 07/03/2015 presented with a steep functional decline having increasing generalized weakness, minimal by mouth intake, not functioning at her baseline. Initial lab work revealed the  presence of acute kidney injury having creatinine of 2.86 along with a potassium of 6.2. Urinalysis was negative. Mental status changes suspected to be multifactorial with dehydration, acute kidney injury, psychotropic medications all probable contributors. Was started on IV fluids were having subsequent improvement to her kidney function.  Objective: Filed Vitals:   07/03/15 2119 07/04/15 0619  BP: 140/82 151/83  Pulse: 69 64  Temp: 97.7 F (36.5 C) 97.9 F (36.6 C)  Resp: 17 18    Intake/Output Summary (Last 24 hours) at 07/04/15 1459 Last data filed at 07/04/15 1347  Gross per 24 hour  Intake   1365 ml  Output    400 ml  Net    965 ml   Filed Weights   07/03/15 1534  Weight: 61.1 kg (134 lb 11.2 oz)    Exam:   General:  No acute distress she is confused disoriented, and related down the hallway  Cardiovascular: Regular rate and rhythm normal S1-S2  Respiratory: Normal respiratory effort  Abdomen: Soft nontender nondistended  Musculoskeletal: No edema  Data Reviewed: Basic Metabolic Panel:  Recent Labs Lab 07/03/15 1126 07/03/15 1808 07/04/15 0509  NA 138 140 142  K 6.2* 4.8 4.3  CL 108 109 110  CO2 18* 20* 20*  GLUCOSE 109* 116* 127*  BUN 81* 61* 45*  CREATININE 2.86* 2.09* 1.56*  CALCIUM 9.9 8.9  8.9 9.0  MG 2.9* 2.5*  --    Liver Function Tests:  Recent Labs Lab 07/03/15 1126 07/04/15 0509  AST 20 20  ALT 10* 10*  ALKPHOS 70 54  BILITOT 0.6 0.9  PROT 9.1* 7.1  ALBUMIN 5.6* 4.2   No results for input(s): LIPASE, AMYLASE in the last 168 hours. No results for input(s): AMMONIA in the last 168 hours.  CBC:  Recent Labs Lab 07/03/15 1126 07/04/15 0509  WBC 4.8 3.6*  NEUTROABS 3.3  --   HGB 14.6 12.0  HCT 41.3 34.6*  MCV 78.4 78.1  PLT 127* 88*   Cardiac Enzymes:  Recent Labs Lab 07/03/15 1126  CKTOTAL 104   BNP (last 3 results) No results for input(s): BNP in the last 8760 hours.  ProBNP (last 3 results) No results for  input(s): PROBNP in the last 8760 hours.  CBG: No results for input(s): GLUCAP in the last 168 hours.  Recent Results (from the past 240 hour(s))  Urine culture     Status: None   Collection Time: 07/03/15 11:53 AM  Result Value Ref Range Status   Specimen Description URINE, CATHETERIZED  Final   Special Requests NONE  Final   Culture MULTIPLE SPECIES PRESENT, SUGGEST RECOLLECTION  Final   Report Status 07/04/2015 FINAL  Final     Studies: No results found.  Scheduled Meds: . sodium chloride flush  3 mL Intravenous Q12H   Continuous Infusions: . sodium chloride 75 mL/hr at 07/04/15 1244    Active Problems:   Hyperkalemia   AKI (acute kidney injury) (HCC)   Acute encephalopathy    Time spent: 35 min    Jeralyn Bennett  [facility] Pager [phone]. If 7PM-7AM, please contact night-coverage at [email or web address], password [facility] 07/04/2015, 2:59 PM  LOS: 1 day

## 2015-07-05 DIAGNOSIS — N179 Acute kidney failure, unspecified: Secondary | ICD-10-CM | POA: Diagnosis not present

## 2015-07-05 DIAGNOSIS — G9340 Encephalopathy, unspecified: Secondary | ICD-10-CM | POA: Diagnosis not present

## 2015-07-05 LAB — BASIC METABOLIC PANEL
ANION GAP: 10 (ref 5–15)
BUN: 24 mg/dL — ABNORMAL HIGH (ref 6–20)
CALCIUM: 8.9 mg/dL (ref 8.9–10.3)
CO2: 19 mmol/L — ABNORMAL LOW (ref 22–32)
Chloride: 110 mmol/L (ref 101–111)
Creatinine, Ser: 1.13 mg/dL — ABNORMAL HIGH (ref 0.44–1.00)
GFR calc Af Amer: 51 mL/min — ABNORMAL LOW (ref >60)
GFR, EST NON AFRICAN AMERICAN: 44 mL/min — AB (ref >60)
Glucose, Bld: 94 mg/dL (ref 65–99)
POTASSIUM: 4.8 mmol/L (ref 3.5–5.1)
SODIUM: 139 mmol/L (ref 135–145)

## 2015-07-05 LAB — CBC
HCT: 34.6 % — ABNORMAL LOW (ref 36.0–46.0)
HEMOGLOBIN: 12.2 g/dL (ref 12.0–15.0)
MCH: 27.4 pg (ref 26.0–34.0)
MCHC: 35.3 g/dL (ref 30.0–36.0)
MCV: 77.8 fL — ABNORMAL LOW (ref 78.0–100.0)
Platelets: DECREASED 10*3/uL (ref 150–400)
RBC: 4.45 MIL/uL (ref 3.87–5.11)
RDW: 14.7 % (ref 11.5–15.5)
WBC: 4 10*3/uL (ref 4.0–10.5)

## 2015-07-05 NOTE — Care Management Note (Signed)
Case Management Note  Patient Details  Name: [name] MRN: [id number] Date of Birth: 06/30/1929  Subjective/Objective:        Acute encephalopathy, AKI, UTI            Action/Plan: Discharge Planning: AVS reviewed:  NCM spoke to pt's son, [name] via phone. States pt was independent prior to hospital stay. [facility]. [facility] and provided Shawn's contact number. Contacted DME rep for RW to be delivered to room prior to dc.  PCP- Dr Pearson GrippeKim James  Expected Discharge Date: 07/05/2015             Expected Discharge Plan:  Home w Home Health Services  In-House Referral:  NA  Discharge planning Services  CM Consult  Post Acute Care Choice:  Home Health Choice offered to:  Adult Children  DME Arranged:  Walker rolling DME Agency:  [facility]  HH Arranged:  PT, RN, OT, Nurse's Aide HH Agency:  [facility]  Status of Service:  Completed, signed off  Medicare Important Message Given:    Date Medicare IM Given:    Medicare IM give by:    Date Additional Medicare IM Given:    Additional Medicare Important Message give by:     If discussed at Long Length of Stay Meetings, dates discussed:    Additional Comments:  Elliot CousinShavis, Taylen Wendland Ellen, RN 07/05/2015, 3:29 PM

## 2015-07-05 NOTE — Discharge Summary (Signed)
Physician Discharge Summary  [name] ZOX:[id number] DOB: [birth year] DOA: 07/03/2015  PCP: Pearson GrippeJames Kim, MD  Admit date: 07/03/2015 Discharge date: 07/05/2015  Time spent: 335 minutes  Recommendations for Outpatient Follow-up:  1. Prior to discharge [facility]/OT and RN 2. Please follow up on BMP on hospital follow up appoint, she presented with AKI.    Discharge Diagnoses:  Active Problems:   Hyperkalemia   AKI (acute kidney injury) (HCC)   Acute encephalopathy   Discharge Condition: Stable  Diet recommendation: Heart Healthy  Filed Weights   07/03/15 1534  Weight: 61.1 kg (134 lb 11.2 oz)    History of present illness:  [name] is a 82 y.o. female past medical history of hypertension hyperlipidemia and complicated urological history followed by Dr. Brunilda PayorNesi status post stent placement and removal back in December of thousand 16 the recently came to the ED about 2 weeks prior to admission was treated for UTI and started on Bactrim. Most of the history is obtained from the chart as the family is not present and the patient is encephalopathic, as per chart the patient has been getting weak for the last several days and confused to the point where this morning she couldn't get get up from bed.  Hospital Course:  [name] is a pleasant 82 year old female with a past medical history of advanced dementia currently resides in the community with family. He was admitted to the medicine service on 07/03/2015 presented with a steep functional decline having increasing generalized weakness, minimal by mouth intake, not functioning at her baseline. Initial lab work revealed the presence of acute kidney injury having creatinine of 2.86 along with a potassium of 6.2. Urinalysis was negative. Mental status changes suspected to be multifactorial with dehydration, acute kidney injury, psychotropic medications all probable contributors. Was started on IV fluids  were having subsequent improvement to her kidney function. By 07/05/2015 her creatinine had come down to 1.13. I spoke to her son who reported that she was back to her baseline. She was discharged home on 07/05/2015 in stable condition with Home Health Services.    Discharge Exam: Filed Vitals:   07/04/15 2052 07/05/15 0433  BP: 120/77 135/87  Pulse: 74 70  Temp: 97.5 F (36.4 C) 97.7 F (36.5 C)  Resp: 18 20    General: No acute distress she is confused disoriented, and related down the hallway  Cardiovascular: Regular rate and rhythm normal S1-S2  Respiratory: Normal respiratory effort  Abdomen: Soft nontender nondistended  Musculoskeletal: No edema   Discharge Instructions   Discharge Instructions    Call MD for:  difficulty breathing, headache or visual disturbances    Complete by:  As directed      Call MD for:  extreme fatigue    Complete by:  As directed      Call MD for:  hives    Complete by:  As directed      Call MD for:  persistant dizziness or light-headedness    Complete by:  As directed      Call MD for:  persistant nausea and vomiting    Complete by:  As directed      Call MD for:  redness, tenderness, or signs of infection (pain, swelling, redness, odor or green/yellow discharge around incision site)    Complete by:  As directed      Call MD for:  severe uncontrolled pain    Complete by:  As directed      Call MD for:  temperature >100.4    Complete by:  As directed      Call MD for:    Complete by:  As directed      Diet - low sodium heart healthy    Complete by:  As directed      Increase activity slowly    Complete by:  As directed           Current Discharge Medication List    CONTINUE these medications which have NOT CHANGED   Details  hydrALAZINE (APRESOLINE) 25 MG tablet Take 25 mg by mouth 2 (two) times daily.      STOP taking these medications     Aspirin-Salicylamide-Caffeine (BC HEADACHE POWDER PO)      gabapentin (NEURONTIN)  100 MG capsule      HYDROcodone-acetaminophen (NORCO/VICODIN) 5-325 MG tablet      sulfamethoxazole-trimethoprim (BACTRIM DS) 800-160 MG tablet        Allergies  Allergen Reactions  . Lisinopril Anaphylaxis  . Bactrim [Sulfamethoxazole-Trimethoprim] Other (See Comments)    AKI  . Codeine Nausea And Vomiting  . Keflex [Cephalexin] Swelling    Angioedema   Follow-up Information    Follow up with Pearson Grippe, MD In 2 weeks.   Specialty:  Internal Medicine   Contact information:   [address] [place] [address] [place] [address] [phone]        The results of significant diagnostics from this hospitalization (including imaging, microbiology, ancillary and laboratory) are listed below for reference.    Significant Diagnostic Studies: Ct Abdomen Pelvis W Contrast  06/13/2015  CLINICAL DATA:  Right-sided abdominal pain for 3 days. EXAM: CT ABDOMEN AND PELVIS WITH CONTRAST TECHNIQUE: Multidetector CT imaging of the abdomen and pelvis was performed using the standard protocol following bolus administration of intravenous contrast. CONTRAST:  25mL OMNIPAQUE IOHEXOL 300 MG/ML SOLN, ISOVUE-300 IOPAMIDOL (ISOVUE-300) INJECTION 61% COMPARISON:  12/26/2013 FINDINGS: Atelectasis in the lung bases. Mild diffuse fatty infiltration of the liver. No focal lesions identified. Cholelithiasis with small stones in the dependent gallbladder. No gallbladder wall thickening. No bile duct dilatation. The pancreas, spleen, adrenal glands, abdominal aorta, inferior vena cava, and retroperitoneal lymph nodes are unremarkable. Left kidney demonstrates asymmetric atrophy or scarring. Mild prominence of intrarenal collecting system without ureterectasis. Large stone in the lower pole left kidney measuring 12 mm diameter. Cysts in the upper pole left kidney. Ureteral stent seen previously has been removed. Right kidney demonstrates mild scarring without hydronephrosis. The stomach, small bowel, and colon are  not abnormally distended. Contrast material flows through to the colon without evidence of bowel obstruction. No free air or free fluid in the abdomen. Surgical clips along the anterior abdominal wall. Pelvis: The appendix is not identified. Scattered diverticula in the sigmoid colon without evidence of diverticulitis. Uterus is surgically absent. Funneling of the base the bladder suggesting pelvic floor laxity with small cystocele. Bladder wall is mildly thickened possibly indicating cystitis. Filling defects in the femoral veins, likely due to mixing of non-opacified blood. No free or loculated pelvic fluid collections. No pelvic mass or lymph the feet. Degenerative changes in the spine. No destructive bone lesions. IMPRESSION: Fatty infiltration of the liver. Cholelithiasis. Left renal atrophy and scarring with prominent intrarenal collecting system. Nonobstructing stone in the lower pole left kidney. Funneling at the base of the bladder suggesting pelvic floor laxity with small cystocele. Bladder wall thickening suggesting cystitis. Electronically Signed   By: Chrissie Noa  Andria Meuse M.D.   On: 06/13/2015 18:59    Microbiology: Recent Results (from the past 240 hour(s))  Urine culture     Status: None   Collection Time: 07/03/15 11:53 AM  Result Value Ref Range Status   Specimen Description URINE, CATHETERIZED  Final   Special Requests NONE  Final   Culture MULTIPLE SPECIES PRESENT, SUGGEST RECOLLECTION  Final   Report Status 07/04/2015 FINAL  Final     Labs: Basic Metabolic Panel:  Recent Labs Lab 07/03/15 1126 07/03/15 1808 07/04/15 0509 07/05/15 0518  NA 138 140 142 139  K 6.2* 4.8 4.3 4.8  CL 108 109 110 110  CO2 18* 20* 20* 19*  GLUCOSE 109* 116* 127* 94  BUN 81* 61* 45* 24*  CREATININE 2.86* 2.09* 1.56* 1.13*  CALCIUM 9.9 8.9  8.9 9.0 8.9  MG 2.9* 2.5*  --   --    Liver Function Tests:  Recent Labs Lab 07/03/15 1126 07/04/15 0509  AST 20 20  ALT 10* 10*  ALKPHOS 70 54   BILITOT 0.6 0.9  PROT 9.1* 7.1  ALBUMIN 5.6* 4.2   No results for input(s): LIPASE, AMYLASE in the last 168 hours. No results for input(s): AMMONIA in the last 168 hours. CBC:  Recent Labs Lab 07/03/15 1126 07/04/15 0509 07/05/15 0518  WBC 4.8 3.6* 4.0  NEUTROABS 3.3  --   --   HGB 14.6 12.0 12.2  HCT 41.3 34.6* 34.6*  MCV 78.4 78.1 77.8*  PLT 127* 88* PLATELET CLUMPS NOTED ON SMEAR, COUNT APPEARS DECREASED   Cardiac Enzymes:  Recent Labs Lab 07/03/15 1126  CKTOTAL 104   BNP: BNP (last 3 results) No results for input(s): BNP in the last 8760 hours.  ProBNP (last 3 results) No results for input(s): PROBNP in the last 8760 hours.  CBG: No results for input(s): GLUCAP in the last 168 hours.     Signed:  Jeralyn Bennett MD.  [facility] 07/05/2015, 1:09 PM

## 2015-07-07 DIAGNOSIS — I10 Essential (primary) hypertension: Secondary | ICD-10-CM | POA: Diagnosis not present

## 2015-07-07 DIAGNOSIS — Z8719 Personal history of other diseases of the digestive system: Secondary | ICD-10-CM | POA: Diagnosis not present

## 2015-07-07 DIAGNOSIS — E119 Type 2 diabetes mellitus without complications: Secondary | ICD-10-CM | POA: Diagnosis not present

## 2015-07-07 DIAGNOSIS — Z87440 Personal history of urinary (tract) infections: Secondary | ICD-10-CM | POA: Diagnosis not present

## 2015-07-07 DIAGNOSIS — E875 Hyperkalemia: Secondary | ICD-10-CM | POA: Diagnosis not present

## 2015-07-07 DIAGNOSIS — E785 Hyperlipidemia, unspecified: Secondary | ICD-10-CM | POA: Diagnosis not present

## 2015-07-10 DIAGNOSIS — E875 Hyperkalemia: Secondary | ICD-10-CM | POA: Diagnosis not present

## 2015-07-10 DIAGNOSIS — E119 Type 2 diabetes mellitus without complications: Secondary | ICD-10-CM | POA: Diagnosis not present

## 2015-07-10 DIAGNOSIS — I10 Essential (primary) hypertension: Secondary | ICD-10-CM | POA: Diagnosis not present

## 2015-07-10 DIAGNOSIS — Z87440 Personal history of urinary (tract) infections: Secondary | ICD-10-CM | POA: Diagnosis not present

## 2015-07-10 DIAGNOSIS — Z8719 Personal history of other diseases of the digestive system: Secondary | ICD-10-CM | POA: Diagnosis not present

## 2015-07-10 DIAGNOSIS — E785 Hyperlipidemia, unspecified: Secondary | ICD-10-CM | POA: Diagnosis not present

## 2015-07-18 DIAGNOSIS — E119 Type 2 diabetes mellitus without complications: Secondary | ICD-10-CM | POA: Diagnosis not present

## 2015-07-18 DIAGNOSIS — I10 Essential (primary) hypertension: Secondary | ICD-10-CM | POA: Diagnosis not present

## 2015-07-18 DIAGNOSIS — Z87440 Personal history of urinary (tract) infections: Secondary | ICD-10-CM | POA: Diagnosis not present

## 2015-07-18 DIAGNOSIS — E785 Hyperlipidemia, unspecified: Secondary | ICD-10-CM | POA: Diagnosis not present

## 2015-07-18 DIAGNOSIS — E875 Hyperkalemia: Secondary | ICD-10-CM | POA: Diagnosis not present

## 2015-07-18 DIAGNOSIS — Z8719 Personal history of other diseases of the digestive system: Secondary | ICD-10-CM | POA: Diagnosis not present

## 2015-07-23 DIAGNOSIS — Z8719 Personal history of other diseases of the digestive system: Secondary | ICD-10-CM | POA: Diagnosis not present

## 2015-07-23 DIAGNOSIS — Z87440 Personal history of urinary (tract) infections: Secondary | ICD-10-CM | POA: Diagnosis not present

## 2015-07-23 DIAGNOSIS — I10 Essential (primary) hypertension: Secondary | ICD-10-CM | POA: Diagnosis not present

## 2015-07-23 DIAGNOSIS — E119 Type 2 diabetes mellitus without complications: Secondary | ICD-10-CM | POA: Diagnosis not present

## 2015-07-23 DIAGNOSIS — E875 Hyperkalemia: Secondary | ICD-10-CM | POA: Diagnosis not present

## 2015-07-23 DIAGNOSIS — E785 Hyperlipidemia, unspecified: Secondary | ICD-10-CM | POA: Diagnosis not present

## 2015-08-01 DIAGNOSIS — I10 Essential (primary) hypertension: Secondary | ICD-10-CM | POA: Diagnosis not present

## 2015-08-01 DIAGNOSIS — E119 Type 2 diabetes mellitus without complications: Secondary | ICD-10-CM | POA: Diagnosis not present

## 2015-08-01 DIAGNOSIS — E875 Hyperkalemia: Secondary | ICD-10-CM | POA: Diagnosis not present

## 2015-08-01 DIAGNOSIS — Z87440 Personal history of urinary (tract) infections: Secondary | ICD-10-CM | POA: Diagnosis not present

## 2015-08-01 DIAGNOSIS — E785 Hyperlipidemia, unspecified: Secondary | ICD-10-CM | POA: Diagnosis not present

## 2015-08-01 DIAGNOSIS — F0390 Unspecified dementia without behavioral disturbance: Secondary | ICD-10-CM | POA: Diagnosis not present

## 2015-08-01 DIAGNOSIS — Z8719 Personal history of other diseases of the digestive system: Secondary | ICD-10-CM | POA: Diagnosis not present

## 2016-05-20 ENCOUNTER — Encounter (HOSPITAL_COMMUNITY): Payer: Self-pay | Admitting: Emergency Medicine

## 2016-05-20 ENCOUNTER — Emergency Department (HOSPITAL_COMMUNITY): Payer: Medicare Other

## 2016-05-20 ENCOUNTER — Emergency Department (HOSPITAL_COMMUNITY)
Admission: EM | Admit: 2016-05-20 | Discharge: 2016-05-20 | Disposition: A | Discharge status: Home / Self Care | Payer: Medicare Other | Attending: Emergency Medicine | Admitting: Emergency Medicine

## 2016-05-20 DIAGNOSIS — Y939 Activity, unspecified: Secondary | ICD-10-CM | POA: Diagnosis not present

## 2016-05-20 DIAGNOSIS — S7001XA Contusion of right hip, initial encounter: Secondary | ICD-10-CM | POA: Insufficient documentation

## 2016-05-20 DIAGNOSIS — Y929 Unspecified place or not applicable: Secondary | ICD-10-CM | POA: Diagnosis not present

## 2016-05-20 DIAGNOSIS — Y999 Unspecified external cause status: Secondary | ICD-10-CM | POA: Diagnosis not present

## 2016-05-20 DIAGNOSIS — S79911A Unspecified injury of right hip, initial encounter: Secondary | ICD-10-CM | POA: Diagnosis present

## 2016-05-20 DIAGNOSIS — W06XXXA Fall from bed, initial encounter: Secondary | ICD-10-CM | POA: Insufficient documentation

## 2016-05-20 DIAGNOSIS — S7011XA Contusion of right thigh, initial encounter: Secondary | ICD-10-CM | POA: Diagnosis not present

## 2016-05-20 DIAGNOSIS — E119 Type 2 diabetes mellitus without complications: Secondary | ICD-10-CM | POA: Insufficient documentation

## 2016-05-20 DIAGNOSIS — Z79899 Other long term (current) drug therapy: Secondary | ICD-10-CM | POA: Diagnosis not present

## 2016-05-20 DIAGNOSIS — I10 Essential (primary) hypertension: Secondary | ICD-10-CM | POA: Insufficient documentation

## 2016-05-20 LAB — BASIC METABOLIC PANEL
ANION GAP: 4 — AB (ref 5–15)
BUN: 14 mg/dL (ref 6–20)
CHLORIDE: 109 mmol/L (ref 101–111)
CO2: 27 mmol/L (ref 22–32)
CREATININE: 0.94 mg/dL (ref 0.44–1.00)
Calcium: 9.2 mg/dL (ref 8.9–10.3)
GFR calc non Af Amer: 54 mL/min — ABNORMAL LOW (ref >60)
Glucose, Bld: 91 mg/dL (ref 65–99)
Potassium: 3.7 mmol/L (ref 3.5–5.1)
Sodium: 140 mmol/L (ref 135–145)

## 2016-05-20 LAB — URINALYSIS, ROUTINE W REFLEX MICROSCOPIC
BILIRUBIN URINE: NEGATIVE
Glucose, UA: NEGATIVE mg/dL
KETONES UR: NEGATIVE mg/dL
Nitrite: NEGATIVE
Protein, ur: NEGATIVE mg/dL
SQUAMOUS EPITHELIAL / LPF: NONE SEEN
Specific Gravity, Urine: 1.013 (ref 1.005–1.030)
pH: 6 (ref 5.0–8.0)

## 2016-05-20 LAB — CBC WITH DIFFERENTIAL/PLATELET
BASOS PCT: 0 %
Basophils Absolute: 0 10*3/uL (ref 0.0–0.1)
EOS PCT: 1 %
Eosinophils Absolute: 0 10*3/uL (ref 0.0–0.7)
HEMATOCRIT: 32.9 % — AB (ref 36.0–46.0)
HEMOGLOBIN: 11.4 g/dL — AB (ref 12.0–15.0)
LYMPHS PCT: 40 %
Lymphs Abs: 1.8 10*3/uL (ref 0.7–4.0)
MCH: 27.4 pg (ref 26.0–34.0)
MCHC: 34.7 g/dL (ref 30.0–36.0)
MCV: 79.1 fL (ref 78.0–100.0)
MONOS PCT: 4 %
Monocytes Absolute: 0.2 10*3/uL (ref 0.1–1.0)
NEUTROS PCT: 55 %
Neutro Abs: 2.4 10*3/uL (ref 1.7–7.7)
Platelets: 113 10*3/uL — ABNORMAL LOW (ref 150–400)
RBC: 4.16 MIL/uL (ref 3.87–5.11)
RDW: 14 % (ref 11.5–15.5)
WBC: 4.4 10*3/uL (ref 4.0–10.5)

## 2016-05-20 MED ORDER — SODIUM CHLORIDE 0.9 % IV BOLUS (SEPSIS): 500.0000 mL | Freq: Once | INTRAVENOUS | Status: DC

## 2016-05-20 MED ORDER — CLONIDINE HCL 0.1 MG/24HR TD PTWK
0.1000 mg | MEDICATED_PATCH | TRANSDERMAL | Status: DC
  Administered 2016-05-20: 0.1 mg via TRANSDERMAL
  Filled 2016-05-20: qty 1

## 2016-05-20 MED ORDER — CLONIDINE HCL 0.1 MG/24HR TD PTWK: 0.1000 mg | MEDICATED_PATCH | TRANSDERMAL | 12 refills | Status: DC

## 2016-05-20 NOTE — ED Notes (Signed)
Family came to desk requesting information on what is going on with patient.  Made him aware that patient just returned from xray so results should be back in about 45-60 mins.

## 2016-05-20 NOTE — ED Triage Notes (Signed)
Per [facility] patient from home for sliding off bed onto butt earlier and patient c/o right leg/hip pain. patient has shortening per EMS. Patient given Fentanyl IM. Patient has advanced dementia

## 2016-05-20 NOTE — ED Notes (Signed)
Stuck patient twice for blood work, patient jumps each time and unable to get blood work.

## 2016-05-20 NOTE — ED Notes (Signed)
Pharmacy called Catapres patch enroute to ED

## 2016-05-20 NOTE — ED Notes (Signed)
ED Provider at bedside. 

## 2016-05-20 NOTE — Discharge Instructions (Signed)
Stop giving her the diuretic medication for blood pressure.  Hopefully the patch that was placed tonight we will treat her blood pressure well.  This patch should be changed every 7 days.  See her primary care doctor as soon as possible for follow-up management.  For pain, take Tylenol.

## 2016-05-20 NOTE — ED Notes (Signed)
Patient transported to X-ray 

## 2016-05-20 NOTE — ED Notes (Signed)
Family states that patient will not keep IV in, she will pull it out.  Asked family if they were ok with drawing lab work then evaluate if IV fluids are needed are not.  Family thought that was best plan.

## 2016-05-20 NOTE — ED Provider Notes (Signed)
[facility]-EMERGENCY DEPT Provider Note   CSN: [id number] Arrival date & time: 05/20/16  1437     History   Chief Complaint Chief Complaint  Patient presents with  . Leg Pain    right    HPI [name] is a 83 y.o. female.  Patient was at home, doing some cooking, felt dizzy so went to sit down.  She did that she slipped to the floor off of her bed.  She presents now for evaluation of right hip pain.  No recent illnesses.  She is unable to give any history.  Level 5 caveat-dementia  HPI  Past Medical History:  Diagnosis Date  . DDD (degenerative disc disease), lumbar   . Full dentures   . Hyperlipidemia   . Hypertension   . Memory loss   . Osteopenia   . Renal calculus, left   . Retained ureteral stent    RETAINED SINCE 2001  . Sigmoid diverticulosis   . Type 2 diabetes mellitus (HCC)   . Vitamin B12 deficiency     Patient Active Problem List   Diagnosis Date Noted  . ATN (acute tubular necrosis) (HCC) 07/03/2015  . Hyperkalemia 07/03/2015  . AKI (acute kidney injury) (HCC) 07/03/2015  . Acute encephalopathy 07/03/2015    Past Surgical History:  Procedure Laterality Date  . APPENDECTOMY  1962  . CATARACT EXTRACTION W/ INTRAOCULAR LENS  IMPLANT, BILATERAL  2000  . CHOLECYSTECTOMY  2004  . CYSTO/  LEFT RETROGRADE PYELOGRAM/  URETERAL STENT PLACEMENT  06-10-1999  . CYSTOSCOPY W/ URETERAL STENT REMOVAL Left 07/30/2014   Procedure: CYSTOSCOPY WITH FRAGMENTED STENT REMOVAL;  Surgeon: Su Grand, MD;  Location: [facility];  Service: Urology;  Laterality: Left;  . OVARIAN CYST SURGERY    . TONSILLECTOMY  1970  . TOTAL ABDOMINAL HYSTERECTOMY WITH RIGHT SALPINGOOPHORECTOMY/  [place]Z PROCEDURE/  COLPOCYSTOPEXY AND BURCH PROCEDURE  11-02-1999  . TUBAL LIGATION  YRS AGO  . URETEROSCOPY Left 07/30/2014   Procedure: URETEROSCOPY;  Surgeon: Su Grand, MD;  Location: [facility];  Service: Urology;  Laterality: Left;    OB History    No  data available       Home Medications    Prior to Admission medications   Medication Sig Start Date End Date Taking? Authorizing Provider  cloNIDine (CATAPRES-TTS-1) 0.1 mg/24hr patch Place 1 patch (0.1 mg total) onto the skin once a week. 05/20/16   Mancel Bale, MD    Family History Family History  Problem Relation Age of Onset  . Diabetes Mother   . Glaucoma Mother   . Heart disease Mother   . Cancer Brother     type unknown  . Hypertension Father   . CVA Father     Social History Social History  Substance Use Topics  . Smoking status: Never Smoker  . Smokeless tobacco: Never Used  . Alcohol use No     Allergies   Lisinopril; Bactrim [sulfamethoxazole-trimethoprim]; Codeine; and Keflex [cephalexin]   Review of Systems Review of Systems  All other systems reviewed and are negative.    Physical Exam Updated Vital Signs BP (!) 174/105 (BP Location: Left Arm)   Pulse (!) 53   Temp 97.9 F (36.6 C) (Oral)   Resp 15   SpO2 97%   Physical Exam  Constitutional: She is oriented to person, place, and time. She appears well-developed. No distress.  Elderly, frail  HENT:  Head: Normocephalic and atraumatic.  Eyes: Conjunctivae and EOM are normal. Pupils  are equal, round, and reactive to light.  Neck: Normal range of motion and phonation normal. Neck supple.  Cardiovascular: Normal rate and regular rhythm.   Pulmonary/Chest: Effort normal and breath sounds normal. She exhibits no tenderness.  Abdominal: Soft. She exhibits no distension. There is no tenderness. There is no guarding.  Musculoskeletal:  Somewhat limited range of motion hips with passive ranging.  Neurological: She is alert and oriented to person, place, and time. She exhibits normal muscle tone.  Skin: Skin is warm and dry.  Psychiatric: She has a normal mood and affect. Her behavior is normal.  Nursing note and vitals reviewed.    ED Treatments / Results  Labs (all labs ordered are listed,  but only abnormal results are displayed) Labs Reviewed  BASIC METABOLIC PANEL - Abnormal; Notable for the following:       Result Value   GFR calc non Af Amer 54 (*)    Anion gap 4 (*)    All other components within normal limits  CBC WITH DIFFERENTIAL/PLATELET - Abnormal; Notable for the following:    Hemoglobin 11.4 (*)    HCT 32.9 (*)    Platelets 113 (*)    All other components within normal limits  URINALYSIS, ROUTINE W REFLEX MICROSCOPIC - Abnormal; Notable for the following:    APPearance HAZY (*)    Hgb urine dipstick SMALL (*)    Leukocytes, UA TRACE (*)    Bacteria, UA RARE (*)    All other components within normal limits  URINE CULTURE    EKG  EKG Interpretation None       Radiology Dg Hip Unilat  With Pelvis 2-3 Views Right  Result Date: 05/20/2016 CLINICAL DATA:  Fall. EXAM: DG HIP (WITH OR WITHOUT PELVIS) 2-3V RIGHT COMPARISON:  None. FINDINGS: Degenerative changes lumbar spine, and both SI joints, and both hips. No acute bony or joint abnormality identified. Aortoiliac atherosclerotic vascular calcification. IMPRESSION: Degenerative changes lumbar spine, both SI joints, and both hips. No acute abnormality identified. Electronically Signed   By: Maisie Fus  Register   On: 05/20/2016 15:50   Dg Femur 1v Right  Result Date: 05/20/2016 CLINICAL DATA:  Right thigh pain after fall today. EXAM: RIGHT FEMUR 1 VIEW COMPARISON:  None. FINDINGS: There is no evidence of fracture or other focal bone lesions. Soft tissues are unremarkable. IMPRESSION: Normal right femur. Electronically Signed   By: Lupita Raider, M.D.   On: 05/20/2016 15:50    Procedures Procedures (including critical care time)  Medications Ordered in ED Medications  sodium chloride 0.9 % bolus 500 mL (0 mLs Intravenous Hold 05/20/16 1635)  cloNIDine (CATAPRES - Dosed in mg/24 hr) patch 0.1 mg (not administered)     Initial Impression / Assessment and Plan / ED Course  I have reviewed the triage vital  signs and the nursing notes.  Pertinent labs & imaging results that were available during my care of the patient were reviewed by me and considered in my medical decision making (see chart for details).     Medications  sodium chloride 0.9 % bolus 500 mL (0 mLs Intravenous Hold 05/20/16 1635)  cloNIDine (CATAPRES - Dosed in mg/24 hr) patch 0.1 mg (not administered)    Patient Vitals for the past 24 hrs:  BP Temp Temp src Pulse Resp SpO2  05/20/16 1852 (!) 174/105 - - (!) 53 15 97 %  05/20/16 1702 (!) 202/87 - - (!) 57 15 98 %  05/20/16 1454 (!) 170/107 97.9 F (36.6  C) Oral (!) 51 18 100 %    7:17 PM Reevaluation with update and discussion. After initial assessment and treatment, an updated evaluation reveals no change in clinical status.  She is now sitting up in the bed comfortable and wants to leave.  Findings discussed with patient's family members, and all questions were answered. Eldoris Beiser L    Final Clinical Impressions(s) / ED Diagnoses   Final diagnoses:  Contusion of right hip and thigh, initial encounter    Contusion hip, with incidental hypertension, complicated by noncompliance.  Patient does not like to take medications and typically refuses it.  Therefore a clonidine patch was placed, to treat hypertension.  Nursing Notes Reviewed/ Care Coordinated Applicable Imaging Reviewed Interpretation of Laboratory Data incorporated into ED treatment  The patient appears reasonably screened and/or stabilized for discharge and I doubt any other medical condition or other [facility] requiring further screening, evaluation, or treatment in the ED at this time prior to discharge.  Plan: Home Medications- stop Diuritic; Home Treatments- rest; return here if the recommended treatment, does not improve the symptoms; Recommended follow up- PCP asap for check up    New Prescriptions New Prescriptions   CLONIDINE (CATAPRES-TTS-1) 0.1 MG/24HR PATCH    Place 1 patch (0.1 mg total) onto  the skin once a week.     Mancel BaleElliott Decklyn Hornik, MD 05/20/16 1919

## 2016-05-23 LAB — URINE CULTURE

## 2016-05-24 NOTE — Progress Notes (Signed)
ED Antimicrobial Stewardship Positive Culture Follow Up   Kimberly FriskJoan E [name] is an 83 y.o. female who [facility] with a chief complaint of  Chief Complaint  Patient presents with  . Leg Pain    right    Recent Results (from the past 720 hour(s))  Urine culture     Status: Abnormal   Collection Time: 05/20/16  4:54 PM  Result Value Ref Range Status   Specimen Description URINE, CATHETERIZED  Final   Special Requests NONE  Final   Culture (A)  Final    >=100,000 COLONIES/mL ENTEROCOCCUS FAECALIS >=100,000 COLONIES/mL AEROCOCCUS URINAE Standardized susceptibility testing for this organism is not available. Performed at [facility], [address] [place], [place] [address]    Report Status 05/23/2016 FINAL  Final   Organism ID, Bacteria ENTEROCOCCUS FAECALIS (A)  Final      Susceptibility   Enterococcus faecalis - MIC*    AMPICILLIN <=2 SENSITIVE Sensitive     LEVOFLOXACIN 2 SENSITIVE Sensitive     NITROFURANTOIN <=16 SENSITIVE Sensitive     VANCOMYCIN 1 SENSITIVE Sensitive     * >=100,000 COLONIES/mL ENTEROCOCCUS FAECALIS    No urinary symptoms reported. Urinalysis and urine micro not consistent with infection.  This represents asymptomatic bacteriuria and no treatment is indicated.  ED Provider: Harolyn RutherfordShawn Joy, PA-C   Sallee Provencalurner, Javionna Leder S 05/24/2016, 8:51 AM Infectious Diseases Pharmacist Phone# [phone]

## 2017-03-12 ENCOUNTER — Emergency Department (HOSPITAL_COMMUNITY): Payer: Medicare Other

## 2017-03-12 ENCOUNTER — Observation Stay (HOSPITAL_COMMUNITY)
Admission: EM | Admit: 2017-03-12 | Discharge: 2017-03-14 | Disposition: A | Discharge status: Home / Self Care | Payer: Medicare Other | Attending: Internal Medicine | Admitting: Internal Medicine

## 2017-03-12 ENCOUNTER — Encounter (HOSPITAL_COMMUNITY): Payer: Self-pay | Admitting: Emergency Medicine

## 2017-03-12 DIAGNOSIS — D6489 Other specified anemias: Secondary | ICD-10-CM | POA: Insufficient documentation

## 2017-03-12 DIAGNOSIS — I10 Essential (primary) hypertension: Secondary | ICD-10-CM | POA: Diagnosis present

## 2017-03-12 DIAGNOSIS — K8020 Calculus of gallbladder without cholecystitis without obstruction: Secondary | ICD-10-CM | POA: Insufficient documentation

## 2017-03-12 DIAGNOSIS — Z79899 Other long term (current) drug therapy: Secondary | ICD-10-CM | POA: Diagnosis not present

## 2017-03-12 DIAGNOSIS — D696 Thrombocytopenia, unspecified: Secondary | ICD-10-CM | POA: Insufficient documentation

## 2017-03-12 DIAGNOSIS — Z885 Allergy status to narcotic agent status: Secondary | ICD-10-CM | POA: Diagnosis not present

## 2017-03-12 DIAGNOSIS — Z781 Physical restraint status: Secondary | ICD-10-CM | POA: Diagnosis not present

## 2017-03-12 DIAGNOSIS — Z66 Do not resuscitate: Secondary | ICD-10-CM | POA: Diagnosis not present

## 2017-03-12 DIAGNOSIS — F0390 Unspecified dementia without behavioral disturbance: Secondary | ICD-10-CM | POA: Diagnosis present

## 2017-03-12 DIAGNOSIS — R001 Bradycardia, unspecified: Principal | ICD-10-CM | POA: Diagnosis present

## 2017-03-12 DIAGNOSIS — Z883 Allergy status to other anti-infective agents status: Secondary | ICD-10-CM | POA: Diagnosis not present

## 2017-03-12 DIAGNOSIS — Z888 Allergy status to other drugs, medicaments and biological substances status: Secondary | ICD-10-CM | POA: Insufficient documentation

## 2017-03-12 DIAGNOSIS — E119 Type 2 diabetes mellitus without complications: Secondary | ICD-10-CM | POA: Diagnosis not present

## 2017-03-12 DIAGNOSIS — F0391 Unspecified dementia with behavioral disturbance: Secondary | ICD-10-CM | POA: Diagnosis not present

## 2017-03-12 DIAGNOSIS — R109 Unspecified abdominal pain: Secondary | ICD-10-CM | POA: Diagnosis not present

## 2017-03-12 DIAGNOSIS — E785 Hyperlipidemia, unspecified: Secondary | ICD-10-CM | POA: Insufficient documentation

## 2017-03-12 LAB — CBC WITH DIFFERENTIAL/PLATELET
BASOS ABS: 0 10*3/uL (ref 0.0–0.1)
Basophils Relative: 0 %
EOS PCT: 2 %
Eosinophils Absolute: 0.1 10*3/uL (ref 0.0–0.7)
HEMATOCRIT: 32 % — AB (ref 36.0–46.0)
HEMOGLOBIN: 11 g/dL — AB (ref 12.0–15.0)
LYMPHS ABS: 2 10*3/uL (ref 0.7–4.0)
LYMPHS PCT: 41 %
MCH: 28 pg (ref 26.0–34.0)
MCHC: 34.4 g/dL (ref 30.0–36.0)
MCV: 81.4 fL (ref 78.0–100.0)
Monocytes Absolute: 0.3 10*3/uL (ref 0.1–1.0)
Monocytes Relative: 5 %
NEUTROS ABS: 2.5 10*3/uL (ref 1.7–7.7)
NEUTROS PCT: 52 %
PLATELETS: 103 10*3/uL — AB (ref 150–400)
RBC: 3.93 MIL/uL (ref 3.87–5.11)
RDW: 13.7 % (ref 11.5–15.5)
WBC: 4.8 10*3/uL (ref 4.0–10.5)

## 2017-03-12 LAB — COMPREHENSIVE METABOLIC PANEL
ALK PHOS: 50 U/L (ref 38–126)
ALT: 10 U/L — AB (ref 14–54)
AST: 17 U/L (ref 15–41)
Albumin: 3.6 g/dL (ref 3.5–5.0)
Anion gap: 6 (ref 5–15)
BUN: 26 mg/dL — AB (ref 6–20)
CALCIUM: 8.3 mg/dL — AB (ref 8.9–10.3)
CHLORIDE: 110 mmol/L (ref 101–111)
CO2: 22 mmol/L (ref 22–32)
CREATININE: 0.87 mg/dL (ref 0.44–1.00)
GFR calc Af Amer: >60 mL/min (ref >60)
GFR, EST NON AFRICAN AMERICAN: 59 mL/min — AB (ref >60)
Glucose, Bld: 84 mg/dL (ref 65–99)
Potassium: 3.6 mmol/L (ref 3.5–5.1)
Sodium: 138 mmol/L (ref 135–145)
Total Bilirubin: 0.9 mg/dL (ref 0.3–1.2)
Total Protein: 6.4 g/dL — ABNORMAL LOW (ref 6.5–8.1)

## 2017-03-12 LAB — I-STAT CHEM 8, ED
BUN: 24 mg/dL — AB (ref 6–20)
Calcium, Ion: 1.16 mmol/L (ref 1.15–1.40)
Chloride: 108 mmol/L (ref 101–111)
Creatinine, Ser: 0.9 mg/dL (ref 0.44–1.00)
GLUCOSE: 83 mg/dL (ref 65–99)
HEMATOCRIT: 32 % — AB (ref 36.0–46.0)
Hemoglobin: 10.9 g/dL — ABNORMAL LOW (ref 12.0–15.0)
POTASSIUM: 3.6 mmol/L (ref 3.5–5.1)
Sodium: 143 mmol/L (ref 135–145)
TCO2: 21 mmol/L — ABNORMAL LOW (ref 22–32)

## 2017-03-12 LAB — LIPASE, BLOOD: LIPASE: 32 U/L (ref 11–51)

## 2017-03-12 LAB — I-STAT TROPONIN, ED: Troponin i, poc: 0.01 ng/mL (ref 0.00–0.08)

## 2017-03-12 MED ORDER — LORAZEPAM 2 MG/ML IJ SOLN
1.0000 mg | Freq: Once | INTRAMUSCULAR | Status: AC
  Administered 2017-03-12: 1 mg via INTRAVENOUS
  Filled 2017-03-12: qty 1

## 2017-03-12 MED ORDER — SODIUM CHLORIDE 0.9 % IV BOLUS (SEPSIS)
1000.0000 mL | Freq: Once | INTRAVENOUS | Status: AC
  Administered 2017-03-12: 1000 mL via INTRAVENOUS

## 2017-03-12 MED ORDER — HALOPERIDOL LACTATE 5 MG/ML IJ SOLN
2.0000 mg | Freq: Once | INTRAMUSCULAR | Status: AC
  Administered 2017-03-12: 2 mg via INTRAVENOUS
  Filled 2017-03-12: qty 1

## 2017-03-12 MED ORDER — IOPAMIDOL (ISOVUE-300) INJECTION 61%
INTRAVENOUS | Status: AC
  Administered 2017-03-12: 100 mL via INTRAVENOUS
  Filled 2017-03-12: qty 100

## 2017-03-12 MED ORDER — CLONIDINE HCL 0.1 MG PO TABS
0.1000 mg | ORAL_TABLET | Freq: Once | ORAL | Status: AC
  Administered 2017-03-12: 0.1 mg via ORAL
  Filled 2017-03-12: qty 1

## 2017-03-12 NOTE — ED Notes (Signed)
EDP aware of pt pulse

## 2017-03-12 NOTE — ED Notes (Signed)
Per CT staff, pt was unable to tolerate procedure. EDP notified and ordered ativan

## 2017-03-12 NOTE — ED Notes (Signed)
Patient transported to CT 

## 2017-03-12 NOTE — ED Notes (Signed)
This RN and another RN attempted to in and out cath. Pt was unable to lay flat and refused the procedure. EDP made aware and stated that procedure could be discontinued until results from CT result

## 2017-03-12 NOTE — ED Provider Notes (Addendum)
  Physical Exam  BP 139/70   Pulse (!) 38   Temp (!) 97.4 F (36.3 C) (Oral)   Resp 15   SpO2 100%   Physical Exam  ED Course/Procedures     Procedures  MDM   Assuming care of patient from Dr. Silverio LayYao.   Patient in the ED for constipation and abdominal discomfort x 3 days. Workup thus far shows no concerning findings on the labs. CT abdomen and pelvis non contrasted is neg as well.  Pt arrived to the ER with hypertension as well. Pt was given clonidine, haldol and ativan, the latter 2 for sedation for CT scan. After the imaging, pt's HR dropped into 30s and her BP dropped to 130s, and she has been sleepy.  According to Dr. Silverio LayYao, plan is to monitor patient until 4 am for the HR and mental status. If patient doesn't improve, admit.  Prior visit had pt's HR in the 50s. EKG today shows sinus bradycardia.    EKG Interpretation  Date/Time:  Saturday March 12 2017 19:47:35 EST Ventricular Rate:  48 PR Interval:    QRS Duration: 96 QT Interval:  455 QTC Calculation: 407 R Axis:   -30 Text Interpretation:  Sinus bradycardia Abnormal R-wave progression, late transition Left ventricular hypertrophy Borderline T abnormalities, inferior leads No significant change since last tracing Confirmed by Richardean CanalYao, David H [phone]([id number]) on 03/12/2017 7:58:35 PM         Derwood KaplanNanavati, Xhaiden Coombs, MD 03/12/17 2357   5:01 AM  HR persistently in the 30s and 40s. Pt is a poor historian, arousable but somnolent. Spoke with Cards. Given that patient is in sinus bradycardia without any AV blockade, the suspect that the bradycardia could be iatrogenic.  They are recommending that patient be observed in the hospital.  They also recommended attempting atropine to see if there is any transient response. Patient's BP is in the 140s and 150s, therefore I do not think that she is having poor perfusion to the brain secondary to the bradycardia.  However, it would be best to admit patient as an observation status at this  time.   CRITICAL CARE Performed by: Reyana Leisey   Total critical care time: 31 minutes  Critical care time was exclusive of separately billable procedures and treating other patients.  Critical care was necessary to treat or prevent imminent or life-threatening deterioration.  Critical care was time spent personally by me on the following activities: development of treatment plan with patient and/or surrogate as well as nursing, discussions with consultants, evaluation of patient's response to treatment, examination of patient, obtaining history from patient or surrogate, ordering and performing treatments and interventions, ordering and review of laboratory studies, ordering and review of radiographic studies, pulse oximetry and re-evaluation of patient's condition.    Derwood KaplanNanavati, Jilda Kress, MD 03/13/17 [phone]

## 2017-03-12 NOTE — ED Notes (Signed)
Family at bedside. 

## 2017-03-12 NOTE — ED Triage Notes (Addendum)
Per EMS, patient from home with family, family reports patient has not had bowel movement in approximately three days. Hx dementia. A&Ox1. Hypertensive with EMS.

## 2017-03-12 NOTE — ED Notes (Signed)
ED Provider at bedside. 

## 2017-03-12 NOTE — ED Provider Notes (Signed)
[facility]-EMERGENCY DEPT Provider Note   CSN: [id number] Arrival date & time: 03/12/17  1918     History   Chief Complaint Chief Complaint  Patient presents with  . Constipation    HPI [name] is a 83 y.o. female history of hypertension, dementia here presenting with abdominal pain, constipation, hypertension.  Patient is demented and is a poor historian.  She lives at home with her son.  Per the son, patient has been constipated for the last several days and complains of lower abdominal pain.  Patient apparently has been hypertensive but there is no reported headache or chest pain or vomiting or fevers at home.  Patient unable to give much history due to dementia.  Patient was seen here several weeks ago for similar symptoms and was prescribed clonidine for hypertension and unclear if she is actually compliant with it. Patient had no recent falls or injuries.  The history is provided by the patient and a relative.    Past Medical History:  Diagnosis Date  . DDD (degenerative disc disease), lumbar   . Full dentures   . Hyperlipidemia   . Hypertension   . Memory loss   . Osteopenia   . Renal calculus, left   . Retained ureteral stent    RETAINED SINCE 2001  . Sigmoid diverticulosis   . Type 2 diabetes mellitus (HCC)   . Vitamin B12 deficiency     Patient Active Problem List   Diagnosis Date Noted  . ATN (acute tubular necrosis) (HCC) 07/03/2015  . Hyperkalemia 07/03/2015  . AKI (acute kidney injury) (HCC) 07/03/2015  . Acute encephalopathy 07/03/2015    Past Surgical History:  Procedure Laterality Date  . APPENDECTOMY  1962  . CATARACT EXTRACTION W/ INTRAOCULAR LENS  IMPLANT, BILATERAL  2000  . CHOLECYSTECTOMY  2004  . CYSTO/  LEFT RETROGRADE PYELOGRAM/  URETERAL STENT PLACEMENT  06-10-1999  . CYSTOSCOPY W/ URETERAL STENT REMOVAL Left 07/30/2014   Procedure: CYSTOSCOPY WITH FRAGMENTED STENT REMOVAL;  Surgeon: Su Grand, MD;  Location:  [facility];  Service: Urology;  Laterality: Left;  . OVARIAN CYST SURGERY    . TONSILLECTOMY  1970  . TOTAL ABDOMINAL HYSTERECTOMY WITH RIGHT SALPINGOOPHORECTOMY/  M[place]CH[place]ITZ PROCEDURE/  COLPOCYSTOPEXY AND BURCH PROCEDURE  11-02-1999  . TUBAL LIGATION  YRS AGO  . URETEROSCOPY Left 07/30/2014   Procedure: URETEROSCOPY;  Surgeon: Su Grand, MD;  Location: [facility];  Service: Urology;  Laterality: Left;    OB History    No data available       Home Medications    Prior to Admission medications   Medication Sig Start Date End Date Taking? Authorizing Provider  cloNIDine (CATAPRES-TTS-1) 0.1 mg/24hr patch Place 1 patch (0.1 mg total) onto the skin once a week. 05/20/16   Mancel Bale, MD    Family History Family History  Problem Relation Age of Onset  . Diabetes Mother   . Glaucoma Mother   . Heart disease Mother   . Cancer Brother        type unknown  . Hypertension Father   . CVA Father     Social History Social History   Tobacco Use  . Smoking status: Never Smoker  . Smokeless tobacco: Never Used  Substance Use Topics  . Alcohol use: No    Alcohol/week: 0.0 oz  . Drug use: No     Allergies   Lisinopril; Bactrim [sulfamethoxazole-trimethoprim]; Codeine; and Keflex [cephalexin]   Review of Systems  Review of Systems  Gastrointestinal: Positive for constipation.  All other systems reviewed and are negative.    Physical Exam Updated Vital Signs BP 139/70   Pulse (!) 38   Temp (!) 97.4 F (36.3 C) (Oral)   Resp 15   SpO2 100%   Physical Exam  Constitutional:  Demented   HENT:  Head: Normocephalic.  Mouth/Throat: Oropharynx is clear and moist.  Eyes: Conjunctivae and EOM are normal. Pupils are equal, round, and reactive to light.  Neck: Normal range of motion. Neck supple.  Cardiovascular: Normal rate, regular rhythm and normal heart sounds.  Pulmonary/Chest: Effort normal and breath sounds normal. No stridor. No  respiratory distress.  Abdominal: Soft. Bowel sounds are normal. She exhibits no distension. There is no tenderness.  Genitourinary:  Genitourinary Comments: Rectal- minimal stool, no stool impaction   Musculoskeletal: Normal range of motion.  Neurological: She is alert.  Demented, moving all extremities, nl strength throughout   Skin: Skin is warm.  Psychiatric:  Unable   Nursing note and vitals reviewed.    ED Treatments / Results  Labs (all labs ordered are listed, but only abnormal results are displayed) Labs Reviewed  CBC WITH DIFFERENTIAL/PLATELET - Abnormal; Notable for the following components:      Result Value   Hemoglobin 11.0 (*)    HCT 32.0 (*)    Platelets 103 (*)    All other components within normal limits  COMPREHENSIVE METABOLIC PANEL - Abnormal; Notable for the following components:   BUN 26 (*)    Calcium 8.3 (*)    Total Protein 6.4 (*)    ALT 10 (*)    GFR calc non Af Amer 59 (*)    All other components within normal limits  I-STAT CHEM 8, ED - Abnormal; Notable for the following components:   BUN 24 (*)    TCO2 21 (*)    Hemoglobin 10.9 (*)    HCT 32.0 (*)    All other components within normal limits  URINE CULTURE  LIPASE, BLOOD  URINALYSIS, ROUTINE W REFLEX MICROSCOPIC  I-STAT TROPONIN, ED    EKG  EKG Interpretation  Date/Time:  Saturday March 12 2017 23:54:59 EST Ventricular Rate:  37 PR Interval:    QRS Duration: 98 QT Interval:  530 QTC Calculation: 416 R Axis:   -15 Text Interpretation:  Sinus bradycardia Abnormal R-wave progression, late transition Left ventricular hypertrophy Nonspecific T abnormalities, inferior leads rate slower than previous Confirmed by Richardean CanalYao, Alver Leete H [phone]([id number]) on 03/13/2017 12:01:35 AM       Radiology Ct Abdomen Pelvis Wo Contrast  Result Date: 03/12/2017 CLINICAL DATA:  Constipation EXAM: CT ABDOMEN AND PELVIS WITHOUT CONTRAST TECHNIQUE: Multidetector CT imaging of the abdomen and pelvis was performed  following the standard protocol without IV contrast. COMPARISON:  1. Abdominal radiograph 03/12/2017 2. CT abdomen pelvis 06/13/2015 FINDINGS: Lower chest: No pulmonary nodules or pleural effusion. No visible pericardial effusion. Hepatobiliary: Normal hepatic contours and density. No visible biliary dilatation. Cholelithiasis without acute inflammation. Pancreas: Normal contours without ductal dilatation. No peripancreatic fluid collection. Spleen: Normal. Adrenals/Urinary Tract: --Adrenal glands: Normal. --Right kidney/ureter: No hydronephrosis or perinephric stranding. No nephrolithiasis. No obstructing ureteral stones. --Left kidney/ureter: Nonobstructive left interpolar calculus measures 11 mm in longest dimension. Generalized left renal atrophy. Exophytic cyst measures 2.3 cm. --Urinary bladder: Unremarkable. Stomach/Bowel: --Stomach/Duodenum: No hiatal hernia or other gastric abnormality. Normal duodenal course and caliber. --Small bowel: No dilatation or inflammation. --Colon: No focal abnormality.  Normal colonic stool volume. --Appendix: Surgically  absent. Vascular/Lymphatic: Normal course and caliber of the major abdominal vessels. No abdominal or pelvic lymphadenopathy. Reproductive: Status post hysterectomy. No adnexal mass. Musculoskeletal. No bony spinal canal stenosis or focal osseous abnormality. Other: None. IMPRESSION: 1. No acute abdominal or pelvic abnormality. Normal colonic stool volume. 2. Nonobstructive left lower pole renal stone with generalized left renal atrophy and scarring. 3. Cholelithiasis without evidence of acute cholecystitis. Electronically Signed   By: Deatra RobinsonKevin  Herman M.D.   On: 03/12/2017 23:34   Dg Abd Acute W/chest  Result Date: 03/12/2017 CLINICAL DATA:  Abdominal pain and constipation. EXAM: DG ABDOMEN ACUTE W/ 1V CHEST COMPARISON:  CT 06/13/2015 FINDINGS: Lungs are adequately inflated with mild hazy opacification of the left base and right midlung which may be due to  atelectasis or infection. No evidence of effusion. Mild cardiomegaly. Remainder of the chest is unremarkable. Abdominopelvic images demonstrate no evidence of free peritoneal air. There is mild fecal retention over the right colon and rectosigmoid colon. No evidence of obstruction. Nonspecific 1 cm calcification over the left mid abdomen likely renal in origin. Mild degenerate change of the spine. IMPRESSION: Nonobstructive bowel gas pattern with mild fecal retention of the right colon and rectosigmoid colon. Hazy opacification over the left base and right midlung which may be due to atelectasis or infection. Mild cardiomegaly. 1 cm left renal stone. Electronically Signed   By: Elberta Fortisaniel  Boyle M.D.   On: 03/12/2017 20:47    Procedures Procedures (including critical care time)  Medications Ordered in ED Medications  sodium chloride 0.9 % bolus 1,000 mL (1,000 mLs Intravenous New Bag/Given 03/12/17 2015)  cloNIDine (CATAPRES) tablet 0.1 mg (0.1 mg Oral Given 03/12/17 2113)  iopamidol (ISOVUE-300) 61 % injection (100 mLs Intravenous Contrast Given 03/12/17 2133)  LORazepam (ATIVAN) injection 1 mg (1 mg Intravenous Given 03/12/17 2206)  haloperidol lactate (HALDOL) injection 2 mg (2 mg Intravenous Given 03/12/17 2216)     Initial Impression / Assessment and Plan / ED Course  I have reviewed the triage vital signs and the nursing notes.  Pertinent labs & imaging results that were available during my care of the patient were reviewed by me and considered in my medical decision making (see chart for details).     [name] is a 83 y.o. female here with abdominal pain, constipation, hypertension. Consider hypertensive urgency vs hypertension from agitation vs constipation vs UTI. Will get labs, UA, CT ab/pel. She is on clonidine so will give home dose of clonidine.   10 pm  She is agitated and refused CT. Given ativan initially but still agitated and ordered haldol.   12:04 AM Patient  comfortably sleeping. HR dropped to 30s, sinus bradycardia with no heart block. Likely secondary to haldol. BP down to 130s. UA pending. Signed out to Dr. Higinio PlanNanvanti to reassess patient around 4 am. If still bradycardic, then admit for observation. If UA positive for UTI, she is not septic, anticipate dc home with abx. Will start miralax, low dose HCTZ if she gets discharged.   Final Clinical Impressions(s) / ED Diagnoses   Final diagnoses:  None    ED Discharge Orders    None       Charlynne PanderYao, Jaelyn Bourgoin Hsienta, MD 03/13/17 0004

## 2017-03-13 ENCOUNTER — Other Ambulatory Visit: Payer: Self-pay

## 2017-03-13 ENCOUNTER — Inpatient Hospital Stay (HOSPITAL_COMMUNITY): Payer: Medicare Other

## 2017-03-13 DIAGNOSIS — R001 Bradycardia, unspecified: Secondary | ICD-10-CM | POA: Diagnosis not present

## 2017-03-13 DIAGNOSIS — F0390 Unspecified dementia without behavioral disturbance: Secondary | ICD-10-CM | POA: Diagnosis present

## 2017-03-13 DIAGNOSIS — I10 Essential (primary) hypertension: Secondary | ICD-10-CM | POA: Diagnosis present

## 2017-03-13 DIAGNOSIS — R109 Unspecified abdominal pain: Secondary | ICD-10-CM | POA: Diagnosis present

## 2017-03-13 LAB — CREATININE, SERUM: Creatinine, Ser: 0.68 mg/dL (ref 0.44–1.00)

## 2017-03-13 LAB — TYPE AND SCREEN
ABO/RH(D): B NEG
ANTIBODY SCREEN: NEGATIVE

## 2017-03-13 LAB — URINALYSIS, ROUTINE W REFLEX MICROSCOPIC
Bilirubin Urine: NEGATIVE
GLUCOSE, UA: NEGATIVE mg/dL
HGB URINE DIPSTICK: NEGATIVE
Ketones, ur: NEGATIVE mg/dL
NITRITE: NEGATIVE
PH: 5 (ref 5.0–8.0)
Protein, ur: NEGATIVE mg/dL
SPECIFIC GRAVITY, URINE: 1.013 (ref 1.005–1.030)

## 2017-03-13 LAB — CBC
HCT: 22.2 % — ABNORMAL LOW (ref 36.0–46.0)
HEMATOCRIT: 34.6 % — AB (ref 36.0–46.0)
HEMOGLOBIN: 11.8 g/dL — AB (ref 12.0–15.0)
Hemoglobin: 7.8 g/dL — ABNORMAL LOW (ref 12.0–15.0)
MCH: 28.9 pg (ref 26.0–34.0)
MCH: 28 pg (ref 26.0–34.0)
MCHC: 35.1 g/dL (ref 30.0–36.0)
MCHC: 34.1 g/dL (ref 30.0–36.0)
MCV: 82.2 fL (ref 78.0–100.0)
MCV: 82 fL (ref 78.0–100.0)
PLATELETS: 64 10*3/uL — AB (ref 150–400)
Platelets: 101 10*3/uL — ABNORMAL LOW (ref 150–400)
RBC: 2.7 MIL/uL — ABNORMAL LOW (ref 3.87–5.11)
RBC: 4.22 MIL/uL (ref 3.87–5.11)
RDW: 13.9 % (ref 11.5–15.5)
RDW: 13.8 % (ref 11.5–15.5)
WBC: 2.4 10*3/uL — AB (ref 4.0–10.5)
WBC: 4.5 10*3/uL (ref 4.0–10.5)

## 2017-03-13 MED ORDER — HYDRALAZINE HCL 20 MG/ML IJ SOLN
5.0000 mg | Freq: Four times a day (QID) | INTRAMUSCULAR | Status: DC | PRN
  Administered 2017-03-13: 5 mg via INTRAVENOUS
  Filled 2017-03-13: qty 1

## 2017-03-13 MED ORDER — ALPRAZOLAM 0.5 MG PO TABS
0.5000 mg | ORAL_TABLET | Freq: Once | ORAL | Status: AC
  Administered 2017-03-13: 0.5 mg via ORAL
  Filled 2017-03-13: qty 1

## 2017-03-13 MED ORDER — DOCUSATE SODIUM 100 MG PO CAPS
100.0000 mg | ORAL_CAPSULE | Freq: Two times a day (BID) | ORAL | Status: DC
  Administered 2017-03-13 – 2017-03-14 (×2): 100 mg via ORAL
  Filled 2017-03-13 (×2): qty 1

## 2017-03-13 MED ORDER — SODIUM CHLORIDE 0.9 % IV SOLN
INTRAVENOUS | Status: AC
  Administered 2017-03-13: 23:00:00 via INTRAVENOUS

## 2017-03-13 MED ORDER — ENOXAPARIN SODIUM 40 MG/0.4ML ~~LOC~~ SOLN: 40.0000 mg | SUBCUTANEOUS | Status: DC

## 2017-03-13 MED ORDER — ATROPINE SULFATE 1 MG/10ML IJ SOSY
0.5000 mg | PREFILLED_SYRINGE | Freq: Once | INTRAMUSCULAR | Status: AC
  Administered 2017-03-13: 0.5 mg via INTRAVENOUS
  Filled 2017-03-13: qty 10

## 2017-03-13 MED ORDER — SODIUM CHLORIDE 0.9 % IV SOLN: 250.0000 mL | INTRAVENOUS | Status: DC | PRN

## 2017-03-13 MED ORDER — HYDROCHLOROTHIAZIDE 12.5 MG PO TABS: 12.5000 mg | ORAL_TABLET | Freq: Every day | ORAL | 0 refills | Status: DC

## 2017-03-13 MED ORDER — POLYETHYLENE GLYCOL 3350 17 G PO PACK
17.0000 g | PACK | Freq: Every day | ORAL | Status: DC | PRN
  Administered 2017-03-13: 17 g via ORAL
  Filled 2017-03-13: qty 1

## 2017-03-13 MED ORDER — SODIUM CHLORIDE 0.9% FLUSH: 3.0000 mL | INTRAVENOUS | Status: DC | PRN

## 2017-03-13 MED ORDER — POLYETHYLENE GLYCOL 3350 17 G PO PACK: 17.0000 g | PACK | Freq: Every day | ORAL | 0 refills | Status: DC

## 2017-03-13 MED ORDER — ALPRAZOLAM 0.25 MG PO TABS
0.2500 mg | ORAL_TABLET | Freq: Once | ORAL | Status: AC
  Administered 2017-03-13: 0.25 mg via ORAL
  Filled 2017-03-13: qty 1

## 2017-03-13 MED ORDER — ENOXAPARIN SODIUM 30 MG/0.3ML ~~LOC~~ SOLN: 30.0000 mg | SUBCUTANEOUS | Status: DC

## 2017-03-13 MED ORDER — ACETAMINOPHEN 650 MG RE SUPP: 650.0000 mg | Freq: Four times a day (QID) | RECTAL | Status: DC | PRN

## 2017-03-13 MED ORDER — SODIUM CHLORIDE 0.9% FLUSH
3.0000 mL | Freq: Two times a day (BID) | INTRAVENOUS | Status: DC
  Administered 2017-03-13 (×2): 3 mL via INTRAVENOUS

## 2017-03-13 MED ORDER — ACETAMINOPHEN 325 MG PO TABS
650.0000 mg | ORAL_TABLET | Freq: Four times a day (QID) | ORAL | Status: DC | PRN
  Administered 2017-03-14: 650 mg via ORAL
  Filled 2017-03-13: qty 2

## 2017-03-13 NOTE — Plan of Care (Signed)
Patient stable, Hr largely in 50's drops down into 40's intermittently.  Patient seemingly asymptomatic during periods of bradycardia.  Patient very confused, oriented only to self, difficult to redirect, requiring one on one from staff much of the time as she pulled her IV out x 1, taking heart monitor off and attempting to get out of bed.  This RN spoke with son [name] on the phone who stated this is the way she acts when she is in the hospital.  Patient lives in her home with her son [name] and per [name] is independent when she is in her own environment.

## 2017-03-13 NOTE — ED Notes (Addendum)
Sharyl NimrodMark Liscano-- [phone] (son)

## 2017-03-13 NOTE — Plan of Care (Signed)
  Progressing Clinical Measurements: Ability to maintain clinical measurements within normal limits will improve 03/13/2017 2252 - Progressing by Cristela FeltSteffens, Sherif Millspaugh P, RN Will remain free from infection 03/13/2017 2252 - Progressing by Cristela FeltSteffens, Selyna Klahn P, RN Diagnostic test results will improve 03/13/2017 2252 - Progressing by Cristela FeltSteffens, Caroll Weinheimer P, RN Respiratory complications will improve 03/13/2017 2252 - Progressing by Cristela FeltSteffens, Temperence Zenor P, RN Cardiovascular complication will be avoided 03/13/2017 2252 - Progressing by Cristela FeltSteffens, Lamarkus Nebel P, RN Activity: Risk for activity intolerance will decrease 03/13/2017 2252 - Progressing by Cristela FeltSteffens, Hart Haas P, RN Elimination: Will not experience complications related to urinary retention 03/13/2017 2252 - Progressing by Cristela FeltSteffens, Mozella Rexrode P, RN Safety: Ability to remain free from injury will improve 03/13/2017 2252 - Progressing by Cristela FeltSteffens, Adison Reifsteck P, RN Skin Integrity: Risk for impaired skin integrity will decrease 03/13/2017 2252 - Progressing by Cristela FeltSteffens, Deyanna Mctier P, RN

## 2017-03-13 NOTE — H&P (Signed)
History and Physical    [name] UJW:[id number] DOB: 1930/03/18 DOA: 03/12/2017  PCP: Pearson Grippe, MD Patient coming from: home  Chief Complaint: Abdominal pain   HPI: [name] is a 83 y.o. female with medical history significant of hypertension and dementia. Pt was brought to to the Emergency department due to abdominal pain.  Pt has a history of dementia and is a poor historian.  Pt's son reports pt has a history of constipation and has been constipated for several days.  Pt was agitated in ED and was given ativan and haldol.  Pt had an episode of bradycardia to the 30's.  This is thought to be second to haldol.  Cardiology was consulted and advised observation admission until pt is more stable.     ED Course: Ct scan shows no acute abnormality.  ED physician reports no fecal impaction.    Review of Systems: As per HPI otherwise all other systems reviewed and are negative   Ambulatory Status:Ambulatory  Past Medical History:  Diagnosis Date  . DDD (degenerative disc disease), lumbar   . Full dentures   . Hyperlipidemia   . Hypertension   . Memory loss   . Osteopenia   . Renal calculus, left   . Retained ureteral stent    RETAINED SINCE 2001  . Sigmoid diverticulosis   . Type 2 diabetes mellitus (HCC)   . Vitamin B12 deficiency     Past Surgical History:  Procedure Laterality Date  . APPENDECTOMY  1962  . CATARACT EXTRACTION W/ INTRAOCULAR LENS  IMPLANT, BILATERAL  2000  . CHOLECYSTECTOMY  2004  . CYSTO/  LEFT RETROGRADE PYELOGRAM/  URETERAL STENT PLACEMENT  06-10-1999  . CYSTOSCOPY W/ URETERAL STENT REMOVAL Left 07/30/2014   Procedure: CYSTOSCOPY WITH FRAGMENTED STENT REMOVAL;  Surgeon: Su Grand, MD;  Location: [facility];  Service: Urology;  Laterality: Left;  . OVARIAN CYST SURGERY    . TONSILLECTOMY  1970  . TOTAL ABDOMINAL HYSTERECTOMY WITH RIGHT SALPINGOOPHORECTOMY/  [place]ITZ PROCEDURE/  COLPOCYSTOPEXY AND BURCH PROCEDURE  11-02-1999    . TUBAL LIGATION  YRS AGO  . URETEROSCOPY Left 07/30/2014   Procedure: URETEROSCOPY;  Surgeon: Su Grand, MD;  Location: [facility];  Service: Urology;  Laterality: Left;    Social History   Socioeconomic History  . Marital status: Single    Spouse name: Not on file  . Number of children: 3  . Years of education: Not on file  . Highest education level: Not on file  Social Needs  . Financial resource strain: Not on file  . Food insecurity - worry: Not on file  . Food insecurity - inability: Not on file  . Transportation needs - medical: Not on file  . Transportation needs - non-medical: Not on file  Occupational History  . Occupation: retired  Tobacco Use  . Smoking status: Never Smoker  . Smokeless tobacco: Never Used  Substance and Sexual Activity  . Alcohol use: No    Alcohol/week: 0.0 oz  . Drug use: No  . Sexual activity: No  Other Topics Concern  . Not on file  Social History Narrative  . Not on file    Allergies  Allergen Reactions  . Lisinopril Anaphylaxis  . Bactrim [Sulfamethoxazole-Trimethoprim] Other (See Comments)    AKI  . Codeine Nausea And Vomiting  . Keflex [Cephalexin] Swelling    Angioedema    Family History  Problem Relation Age of Onset  . Diabetes Mother   .  Glaucoma Mother   . Heart disease Mother   . Cancer Brother        type unknown  . Hypertension Father   . CVA Father     Prior to Admission medications   Medication Sig Start Date End Date Taking? Authorizing Provider  cloNIDine (CATAPRES-TTS-1) 0.1 mg/24hr patch Place 1 patch (0.1 mg total) onto the skin once a week. 05/20/16   Mancel BaleWentz, Elliott, MD  hydrochlorothiazide (HYDRODIURIL) 12.5 MG tablet Take 1 tablet (12.5 mg total) by mouth daily. 03/13/17   Charlynne PanderYao, David Hsienta, MD  polyethylene glycol [facility](MIRALAX / Ethelene HalGLYCOLAX) packet Take 17 g by mouth daily. 03/13/17   Charlynne PanderYao, David Hsienta, MD    Physical Exam: Vitals:   03/13/17 0600 03/13/17 0630 03/13/17 0645 03/13/17  0700  BP: (!) 152/70 (!) 145/75  (!) 160/78  Pulse: (!) 41  (!) 41 (!) 42  Resp: 16  18 18   Temp:      TempSrc:      SpO2: 100%  100% 100%     General: Appears calm and comfortable Eyes: PERRL, EOMI, normal lids, iris ENT:  grossly normal hearing, lips & tongue, mmm Neck:  no LAD, masses or thyromegaly Cardiovascular: BRadycardai 50, no m/r/g. No LE edema.  Respiratory:  CTA bilaterally, no w/r/r. Normal respiratory effort. Abdomen:  soft, ntnd, NABS Skin:  no rash or induration seen on limited exam Musculoskeletal:  grossly normal tone BUE/BLE, good ROM, no bony abnormality Psychiatric: dementia Neurologic:  CN 2-12 grossly intact, moves all extremities in coordinated fashion, sensation intact  Labs on Admission: I have personally reviewed following labs and imaging studies  CBC: Recent Labs  Lab 03/12/17 2056 03/12/17 2110  WBC 4.8  --   NEUTROABS 2.5  --   HGB 11.0* 10.9*  HCT 32.0* 32.0*  MCV 81.4  --   PLT 103*  --    Basic Metabolic Panel: Recent Labs  Lab 03/12/17 2056 03/12/17 2110  NA 138 143  K 3.6 3.6  CL 110 108  CO2 22  --   GLUCOSE 84 83  BUN 26* 24*  CREATININE 0.87 0.90  CALCIUM 8.3*  --    GFR: CrCl cannot be calculated (Unknown ideal weight.). Liver Function Tests: Recent Labs  Lab 03/12/17 2056  AST 17  ALT 10*  ALKPHOS 50  BILITOT 0.9  PROT 6.4*  ALBUMIN 3.6   Recent Labs  Lab 03/12/17 2056  LIPASE 32   No results for input(s): AMMONIA in the last 168 hours. Coagulation Profile: No results for input(s): INR, PROTIME in the last 168 hours. Cardiac Enzymes: No results for input(s): CKTOTAL, CKMB, CKMBINDEX, TROPONINI in the last 168 hours. BNP (last 3 results) No results for input(s): PROBNP in the last 8760 hours. HbA1C: No results for input(s): HGBA1C in the last 72 hours. CBG: No results for input(s): GLUCAP in the last 168 hours. Lipid Profile: No results for input(s): CHOL, HDL, LDLCALC, TRIG, CHOLHDL, LDLDIRECT in  the last 72 hours. Thyroid Function Tests: No results for input(s): TSH, T4TOTAL, FREET4, T3FREE, THYROIDAB in the last 72 hours. Anemia Panel: No results for input(s): VITAMINB12, FOLATE, FERRITIN, TIBC, IRON, RETICCTPCT in the last 72 hours. Urine analysis:    Component Value Date/Time   COLORURINE YELLOW 05/20/2016 1654   APPEARANCEUR HAZY (A) 05/20/2016 1654   LABSPEC 1.013 05/20/2016 1654   PHURINE 6.0 05/20/2016 1654   GLUCOSEU NEGATIVE 05/20/2016 1654   HGBUR SMALL (A) 05/20/2016 1654   BILIRUBINUR NEGATIVE 05/20/2016 1654  KETONESUR NEGATIVE 05/20/2016 1654   PROTEINUR NEGATIVE 05/20/2016 1654   UROBILINOGEN 0.2 10/17/2014 2304   NITRITE NEGATIVE 05/20/2016 1654   LEUKOCYTESUR TRACE (A) 05/20/2016 1654    Creatinine Clearance: CrCl cannot be calculated (Unknown ideal weight.).  Sepsis Labs: @LABRCNTIP (procalcitonin:4,lacticidven:4) )No results found for this or any previous visit (from the past 240 hour(s)).   Radiological Exams on Admission: Ct Abdomen Pelvis Wo Contrast  Result Date: 03/12/2017 CLINICAL DATA:  Constipation EXAM: CT ABDOMEN AND PELVIS WITHOUT CONTRAST TECHNIQUE: Multidetector CT imaging of the abdomen and pelvis was performed following the standard protocol without IV contrast. COMPARISON:  1. Abdominal radiograph 03/12/2017 2. CT abdomen pelvis 06/13/2015 FINDINGS: Lower chest: No pulmonary nodules or pleural effusion. No visible pericardial effusion. Hepatobiliary: Normal hepatic contours and density. No visible biliary dilatation. Cholelithiasis without acute inflammation. Pancreas: Normal contours without ductal dilatation. No peripancreatic fluid collection. Spleen: Normal. Adrenals/Urinary Tract: --Adrenal glands: Normal. --Right kidney/ureter: No hydronephrosis or perinephric stranding. No nephrolithiasis. No obstructing ureteral stones. --Left kidney/ureter: Nonobstructive left interpolar calculus measures 11 mm in longest dimension. Generalized  left renal atrophy. Exophytic cyst measures 2.3 cm. --Urinary bladder: Unremarkable. Stomach/Bowel: --Stomach/Duodenum: No hiatal hernia or other gastric abnormality. Normal duodenal course and caliber. --Small bowel: No dilatation or inflammation. --Colon: No focal abnormality.  Normal colonic stool volume. --Appendix: Surgically absent. Vascular/Lymphatic: Normal course and caliber of the major abdominal vessels. No abdominal or pelvic lymphadenopathy. Reproductive: Status post hysterectomy. No adnexal mass. Musculoskeletal. No bony spinal canal stenosis or focal osseous abnormality. Other: None. IMPRESSION: 1. No acute abdominal or pelvic abnormality. Normal colonic stool volume. 2. Nonobstructive left lower pole renal stone with generalized left renal atrophy and scarring. 3. Cholelithiasis without evidence of acute cholecystitis. Electronically Signed   By: Deatra RobinsonKevin  Herman M.D.   On: 03/12/2017 23:34   Dg Abd Acute W/chest  Result Date: 03/12/2017 CLINICAL DATA:  Abdominal pain and constipation. EXAM: DG ABDOMEN ACUTE W/ 1V CHEST COMPARISON:  CT 06/13/2015 FINDINGS: Lungs are adequately inflated with mild hazy opacification of the left base and right midlung which may be due to atelectasis or infection. No evidence of effusion. Mild cardiomegaly. Remainder of the chest is unremarkable. Abdominopelvic images demonstrate no evidence of free peritoneal air. There is mild fecal retention over the right colon and rectosigmoid colon. No evidence of obstruction. Nonspecific 1 cm calcification over the left mid abdomen likely renal in origin. Mild degenerate change of the spine. IMPRESSION: Nonobstructive bowel gas pattern with mild fecal retention of the right colon and rectosigmoid colon. Hazy opacification over the left base and right midlung which may be due to atelectasis or infection. Mild cardiomegaly. 1 cm left renal stone. Electronically Signed   By: Elberta Fortisaniel  Boyle M.D.   On: 03/12/2017 20:47    EKG:  Independently reviewed.   Assessment/Plan Active Problems:   Bradycardia        DVT prophylaxis: lovenox Code Status: full Family Communication: Son at bedside  Disposition Plan: Observation  Consults called:   Admission status: Observation   Langston MaskerKaren Hiro Vipond PA-C [facility]  If 7PM-7AM, please contact night-coverage [email or web address] Password [email or web address]  03/13/2017, 8:31 AM

## 2017-03-13 NOTE — ED Notes (Signed)
Purewick still in place. Pt reminded of need for urine sample

## 2017-03-13 NOTE — Plan of Care (Signed)
This is attestation for H&P written by Campbell LernerLeslie Sophia on 03/13/2017.  I have seen and evaluated the patient and agree with the nurse practitioner's plan.  Ms. [name] is a 83 year old female medical history significant for dementia and hypertension who presented to the ED with family who were concerned for patient's worsening abdominal pain.  History obtained from son by phone.  He states his mother has been without a bowel movement for several days.  She also has significant abdominal discomfort during the same time.  She did not take any bowel regimen at home.  Aside from the above-mentioned symptoms son reports patient has been in usual state of health.  At baseline patient is able to do some basic activities of daily living but otherwise depends on her signs for much of her care.  She has not had any recent falls or injuries that he is aware of.  Additionally patient has not complained of any chest pain, nausea or vomiting, fevers or chills or other concerning signs or symptoms per son.  In the ED patient was found to be afebrile, heart rate initially 48, blood pressure elevated at 224/97.  Patient was was given 0.1 mg clonidine (on clonidine patch at home).  ED course further complicated by worsening agitation requiring Haldol.  Shortly after patient's heart rate decreased to 30s.  EKG shows sinus bradycardia with no heart block.   ED physician spoke with cardiology who recommended atropine to assess for any transient response. CT abdomen and pelvis showed no acute abdominal abnormality with normal colonic stool volume.  Nonobstructive left renal stone.  Cholelithiasis without acute cholecystitis.    Bradycardia, likely iatrogenic, stable Most likely associated with Haldol given for agitation while in the ED.  Encouraging no heart block on EKG -Continue to monitor on telemetry.  Blood pressure remained stable.  Has not required repeat atropine  Abdominal pain, stable Initially thought secondary to  constipation however imaging shows no stool burden.  Does suggest cholelithiasis but no right upper quadrant abdominal pain additionally CMP shows no abnormal hepatic lab function.  Left renal stone present but nonobstructive UA negative for UTI -We will still optimize her bowel regimen -Encourage hydration, maintenance fluids time overnight  Incidental pancytopenia, resolved Likely lab error as repeat CBC more consistent with prior with normal white count, (hemoglobin 11.8, white count 4.5, platelets 101)

## 2017-03-13 NOTE — Discharge Instructions (Signed)
Stop taking clonidine. Take HCTZ instead for your blood pressure.   Take miralax daily for a week   See your doctor in a week to recheck blood pressure and for reassessment   Return to ER if you have chest pain, abdominal pain, lethargy, confusion, vomiting, fever.

## 2017-03-13 NOTE — ED Notes (Signed)
Lab reports HGB of 7.8

## 2017-03-14 DIAGNOSIS — F0390 Unspecified dementia without behavioral disturbance: Secondary | ICD-10-CM | POA: Diagnosis not present

## 2017-03-14 DIAGNOSIS — R001 Bradycardia, unspecified: Secondary | ICD-10-CM | POA: Diagnosis not present

## 2017-03-14 DIAGNOSIS — R109 Unspecified abdominal pain: Secondary | ICD-10-CM | POA: Diagnosis not present

## 2017-03-14 LAB — BASIC METABOLIC PANEL WITH GFR
Anion gap: 10 (ref 5–15)
BUN: 16 mg/dL (ref 6–20)
CO2: 20 mmol/L — ABNORMAL LOW (ref 22–32)
Calcium: 9.1 mg/dL (ref 8.9–10.3)
Chloride: 107 mmol/L (ref 101–111)
Creatinine, Ser: 0.8 mg/dL (ref 0.44–1.00)
GFR calc Af Amer: >60 mL/min
GFR calc non Af Amer: >60 mL/min
Glucose, Bld: 111 mg/dL — ABNORMAL HIGH (ref 65–99)
Potassium: 3.4 mmol/L — ABNORMAL LOW (ref 3.5–5.1)
Sodium: 137 mmol/L (ref 135–145)

## 2017-03-14 LAB — CBC
HCT: 35.9 % — ABNORMAL LOW (ref 36.0–46.0)
Hemoglobin: 12.3 g/dL (ref 12.0–15.0)
MCH: 27.8 pg (ref 26.0–34.0)
MCHC: 34.3 g/dL (ref 30.0–36.0)
MCV: 81.2 fL (ref 78.0–100.0)
Platelets: 104 K/uL — ABNORMAL LOW (ref 150–400)
RBC: 4.42 MIL/uL (ref 3.87–5.11)
RDW: 13.6 % (ref 11.5–15.5)
WBC: 5.5 K/uL (ref 4.0–10.5)

## 2017-03-14 LAB — IRON AND TIBC
IRON: 76 ug/dL (ref 28–170)
Saturation Ratios: 23 % (ref 10.4–31.8)
TIBC: 333 ug/dL (ref 250–450)
UIBC: 257 ug/dL

## 2017-03-14 LAB — ABO/RH: ABO/RH(D): B NEG

## 2017-03-14 MED ORDER — POLYETHYLENE GLYCOL 3350 17 G PO PACK
17.0000 g | PACK | Freq: Every day | ORAL | Status: DC
Start: 2017-03-14 — End: 2017-03-14
  Administered 2017-03-14: 17 g via ORAL
  Filled 2017-03-14: qty 1

## 2017-03-14 MED ORDER — POLYETHYLENE GLYCOL 3350 17 G PO PACK: 17.0000 g | PACK | Freq: Every day | ORAL | 0 refills | Status: DC | PRN

## 2017-03-14 MED ORDER — HALOPERIDOL LACTATE 5 MG/ML IJ SOLN
1.0000 mg | Freq: Once | INTRAMUSCULAR | Status: AC
  Administered 2017-03-14: 1 mg via INTRAVENOUS
  Filled 2017-03-14: qty 1

## 2017-03-14 MED ORDER — HYDROCHLOROTHIAZIDE 12.5 MG PO TABS: 12.5000 mg | ORAL_TABLET | Freq: Every day | ORAL | 0 refills | Status: DC

## 2017-03-14 MED ORDER — DOCUSATE SODIUM 100 MG PO CAPS: 100.0000 mg | ORAL_CAPSULE | Freq: Two times a day (BID) | ORAL | 0 refills | Status: DC

## 2017-03-14 NOTE — Progress Notes (Signed)
Went over discharge paperwork with patient sons.  Explained new medications and when medications are due.  VSS.  PT wheeled out via NT.  Paperwork given to son.

## 2017-03-14 NOTE — Discharge Summary (Signed)
Physician Discharge Summary  Storm FriskJoan E Rybka [id number] DOB: October 22, 1929 DOA: 03/12/2017  PCP: Pearson GrippeKim, James, MD  Admit date: 03/12/2017 Discharge date: 03/14/2017  Admitted From: Home Disposition:  Home  Recommendations for Outpatient Follow-up:  1. Follow up with PCP in 1week 2. Patient will benefit from outpatient evaluation by cardiology for bradycardia 3. Patient might benefit from outpatient evaluation by general surgery for cholelithiasis   Home Health: No  Equipment/Devices: None  Discharge Condition: Guarded CODE STATUS: DO NOT RESUSCITATE  Diet recommendation: Heart Healthy  Brief/Interim Summary: 83 year old female with history of hypertension and dementia presented with abdominal pain. She was agitated in the ED and was given Ativan and Haldol, subsequently had episode of bradycardia with heart rate dropping into the 30s. ED provider consulted cardiology who recommended observation. Heart rate since then has remained bradycardic but stable. Patient had to be put on restraints because of agitation. Family members/sons' at bedside think that she will do better off at home and wanted to go home. She would benefit from outpatient evaluation by cardiology and general surgery.    Discharge Diagnoses:  Active Problems:   Bradycardia   Abdominal pain   Dementia   Essential hypertension   Bradycardia, likely iatrogenic, stable Most likely associated with Haldol given for agitation while in the ED.  Encouraging no heart block on EKG -Much improved. Family members want to take the patient home. Patient would benefit from outpatient cardiology evaluation. Avoid clonidine.  Abdominal pain, stable Initially thought secondary to constipation however imaging shows no stool burden.  Does suggest cholelithiasis but no right upper quadrant abdominal pain additionally CMP shows no abnormal hepatic lab function.  Left renal stone present but nonobstructive UA negative for UTI - Patient  would benefit from outpatient general surgery evaluation -Continue laxatives   Dementia with agitation - Patient was put on restraints overnight. Sons at bedside want to take her home. Discontinue restraints. Discharge patient home. Outpatient follow-up with primary care provider  Incidental pancytopenia, resolved - White count has improved. Patient has chronic anemia and thrombocytopenia questionable cause. Outpatient follow-up    Discharge Instructions  Discharge Instructions    Ambulatory referral to Cardiology   Complete by:  As directed    Bradycardia; recent admission   Call MD for:  difficulty breathing, headache or visual disturbances   Complete by:  As directed    Call MD for:  extreme fatigue   Complete by:  As directed    Call MD for:  hives   Complete by:  As directed    Call MD for:  persistant dizziness or light-headedness   Complete by:  As directed    Call MD for:  persistant nausea and vomiting   Complete by:  As directed    Call MD for:  severe uncontrolled pain   Complete by:  As directed    Call MD for:  temperature >100.4   Complete by:  As directed    Diet - low sodium heart healthy   Complete by:  As directed    Increase activity slowly   Complete by:  As directed      Allergies as of 03/14/2017      Reactions   Lisinopril Anaphylaxis   Bactrim [sulfamethoxazole-trimethoprim] Other (See Comments)   AKI   Codeine Nausea And Vomiting   Keflex [cephalexin] Swelling   Angioedema      Medication List    STOP taking these medications   cloNIDine 0.1 mg/24hr patch Commonly known as:  CATAPRES-TTS-1  TAKE these medications   docusate sodium 100 MG capsule Commonly known as:  COLACE Take 1 capsule (100 mg total) by mouth 2 (two) times daily.   hydrALAZINE 25 MG tablet Commonly known as:  APRESOLINE Take 25 mg by mouth 2 (two) times daily.   hydrochlorothiazide 12.5 MG tablet Commonly known as:  HYDRODIURIL Take 1 tablet (12.5 mg  total) by mouth daily.   ibuprofen 200 MG tablet Commonly known as:  ADVIL,MOTRIN Take 400 mg by mouth every 8 (eight) hours as needed for moderate pain.   polyethylene glycol packet Commonly known as:  MIRALAX / GLYCOLAX Take 17 g by mouth daily.      Follow-up Information    Pearson Grippe, MD. Schedule an appointment as soon as possible for a visit in 1 week(s).   Specialty:  Internal Medicine Contact information: [address] [place] [address] [place] [address] [phone]          Allergies  Allergen Reactions  . Lisinopril Anaphylaxis  . Bactrim [Sulfamethoxazole-Trimethoprim] Other (See Comments)    AKI  . Codeine Nausea And Vomiting  . Keflex [Cephalexin] Swelling    Angioedema    Consultations: None Procedures/Studies: Ct Abdomen Pelvis Wo Contrast  Result Date: 03/12/2017 CLINICAL DATA:  Constipation EXAM: CT ABDOMEN AND PELVIS WITHOUT CONTRAST TECHNIQUE: Multidetector CT imaging of the abdomen and pelvis was performed following the standard protocol without IV contrast. COMPARISON:  1. Abdominal radiograph 03/12/2017 2. CT abdomen pelvis 06/13/2015 FINDINGS: Lower chest: No pulmonary nodules or pleural effusion. No visible pericardial effusion. Hepatobiliary: Normal hepatic contours and density. No visible biliary dilatation. Cholelithiasis without acute inflammation. Pancreas: Normal contours without ductal dilatation. No peripancreatic fluid collection. Spleen: Normal. Adrenals/Urinary Tract: --Adrenal glands: Normal. --Right kidney/ureter: No hydronephrosis or perinephric stranding. No nephrolithiasis. No obstructing ureteral stones. --Left kidney/ureter: Nonobstructive left interpolar calculus measures 11 mm in longest dimension. Generalized left renal atrophy. Exophytic cyst measures 2.3 cm. --Urinary bladder: Unremarkable. Stomach/Bowel: --Stomach/Duodenum: No hiatal hernia or other gastric abnormality. Normal duodenal course and caliber. --Small bowel: No  dilatation or inflammation. --Colon: No focal abnormality.  Normal colonic stool volume. --Appendix: Surgically absent. Vascular/Lymphatic: Normal course and caliber of the major abdominal vessels. No abdominal or pelvic lymphadenopathy. Reproductive: Status post hysterectomy. No adnexal mass. Musculoskeletal. No bony spinal canal stenosis or focal osseous abnormality. Other: None. IMPRESSION: 1. No acute abdominal or pelvic abnormality. Normal colonic stool volume. 2. Nonobstructive left lower pole renal stone with generalized left renal atrophy and scarring. 3. Cholelithiasis without evidence of acute cholecystitis. Electronically Signed   By: Deatra Robinson M.D.   On: 03/12/2017 23:34   Ct Head Wo Contrast  Result Date: 03/13/2017 CLINICAL DATA:  Altered level of consciousness. EXAM: CT HEAD WITHOUT CONTRAST TECHNIQUE: Contiguous axial images were obtained from the base of the skull through the vertex without intravenous contrast. COMPARISON:  None. FINDINGS: Brain: No evidence of acute infarction, hemorrhage, hydrocephalus, extra-axial collection or mass lesion/mass effect. Advanced medial temporal volume loss on the left. Patient has history of memory loss and this pattern can be seen with Alzheimer's disease or semantic dementia. Mild microvascular ischemic change in the cerebral white matter. Vascular: No hyperdense vessel or unexpected calcification. Skull: Normal. Negative for fracture or focal lesion. Sinuses/Orbits: No acute finding. IMPRESSION: 1. No acute finding. 2. Advanced left temporal lobe atrophy, as above. Electronically Signed   By: Marnee Spring M.D.   On: 03/13/2017 09:05   Dg Abd Acute W/chest  Result Date: 03/12/2017 CLINICAL DATA:  Abdominal  pain and constipation. EXAM: DG ABDOMEN ACUTE W/ 1V CHEST COMPARISON:  CT 06/13/2015 FINDINGS: Lungs are adequately inflated with mild hazy opacification of the left base and right midlung which may be due to atelectasis or infection. No  evidence of effusion. Mild cardiomegaly. Remainder of the chest is unremarkable. Abdominopelvic images demonstrate no evidence of free peritoneal air. There is mild fecal retention over the right colon and rectosigmoid colon. No evidence of obstruction. Nonspecific 1 cm calcification over the left mid abdomen likely renal in origin. Mild degenerate change of the spine. IMPRESSION: Nonobstructive bowel gas pattern with mild fecal retention of the right colon and rectosigmoid colon. Hazy opacification over the left base and right midlung which may be due to atelectasis or infection. Mild cardiomegaly. 1 cm left renal stone. Electronically Signed   By: Elberta Fortis M.D.   On: 03/12/2017 20:47      Subjective: Patient seen and examined at bedside. She is very drowsy, wakes up slightly does not answer much questions. No overnight fever or vomiting.  Discharge Exam: Vitals:   03/13/17 2306 03/14/17 0300  BP: (!) 173/77 (!) 163/82  Pulse: (!) 49 63  Resp:  16  Temp:  97.8 F (36.6 C)  SpO2:  100%   Vitals:   03/13/17 1405 03/13/17 2151 03/13/17 2306 03/14/17 0300  BP: (!) 149/90 (!) 192/103 (!) 173/77 (!) 163/82  Pulse: (!) 42 61 (!) 49 63  Resp: 18 16  16   Temp: 97.8 F (36.6 C) 98.1 F (36.7 C)  97.8 F (36.6 C)  TempSrc: Oral Oral  Oral  SpO2: 100% 100%  100%  Weight: 65.1 kg (143 lb 8.3 oz)       General: Pt is elderly female lying in bed, drowsy, wakes up slightly but does not answer much questions Cardiovascular: Currently rate controlled, S1/S2 + Respiratory: Bilateral decreased breath sounds at bases  Abdominal: Soft, NT, ND, bowel sounds + Extremities: no edema, no cyanosis    The results of significant diagnostics from this hospitalization (including imaging, microbiology, ancillary and laboratory) are listed below for reference.     Microbiology: Recent Results (from the past 240 hour(s))  Urine culture     Status: None (Preliminary result)   Collection Time:  03/12/17  7:48 AM  Result Value Ref Range Status   Specimen Description URINE, CLEAN CATCH  Final   Special Requests NONE  Final   Culture   Final    CULTURE REINCUBATED FOR BETTER GROWTH Performed at [facility], [address] [place], [place] [address]    Report Status PENDING  Incomplete     Labs: BNP (last 3 results) No results for input(s): BNP in the last 8760 hours. Basic Metabolic Panel: Recent Labs  Lab 03/12/17 2056 03/12/17 2110 03/13/17 0945 03/14/17 0425  NA 138 143  --  137  K 3.6 3.6  --  3.4*  CL 110 108  --  107  CO2 22  --   --  20*  GLUCOSE 84 83  --  111*  BUN 26* 24*  --  16  CREATININE 0.87 0.90 0.68 0.80  CALCIUM 8.3*  --   --  9.1   Liver Function Tests: Recent Labs  Lab 03/12/17 2056  AST 17  ALT 10*  ALKPHOS 50  BILITOT 0.9  PROT 6.4*  ALBUMIN 3.6   Recent Labs  Lab 03/12/17 2056  LIPASE 32   No results for input(s): AMMONIA in the last 168 hours. CBC:  Recent Labs  Lab 03/12/17 2056 03/12/17 2110 03/13/17 0945 03/13/17 1802 03/14/17 0425  WBC 4.8  --  2.4* 4.5 5.5  NEUTROABS 2.5  --   --   --   --   HGB 11.0* 10.9* 7.8* 11.8* 12.3  HCT 32.0* 32.0* 22.2* 34.6* 35.9*  MCV 81.4  --  82.2 82.0 81.2  PLT 103*  --  64* 101* 104*   Cardiac Enzymes: No results for input(s): CKTOTAL, CKMB, CKMBINDEX, TROPONINI in the last 168 hours. BNP: Invalid input(s): POCBNP CBG: No results for input(s): GLUCAP in the last 168 hours. D-Dimer No results for input(s): DDIMER in the last 72 hours. Hgb A1c No results for input(s): HGBA1C in the last 72 hours. Lipid Profile No results for input(s): CHOL, HDL, LDLCALC, TRIG, CHOLHDL, LDLDIRECT in the last 72 hours. Thyroid function studies No results for input(s): TSH, T4TOTAL, T3FREE, THYROIDAB in the last 72 hours.  Invalid input(s): FREET3 Anemia work up Recent Labs    03/13/17 1802  TIBC 333  IRON 76   Urinalysis    Component Value Date/Time   COLORURINE YELLOW  03/12/2017 0748   APPEARANCEUR HAZY (A) 03/12/2017 0748   LABSPEC 1.013 03/12/2017 0748   PHURINE 5.0 03/12/2017 0748   GLUCOSEU NEGATIVE 03/12/2017 0748   HGBUR NEGATIVE 03/12/2017 0748   BILIRUBINUR NEGATIVE 03/12/2017 0748   KETONESUR NEGATIVE 03/12/2017 0748   PROTEINUR NEGATIVE 03/12/2017 0748   UROBILINOGEN 0.2 10/17/2014 2304   NITRITE NEGATIVE 03/12/2017 0748   LEUKOCYTESUR SMALL (A) 03/12/2017 0748   Sepsis Labs Invalid input(s): PROCALCITONIN,  WBC,  LACTICIDVEN Microbiology Recent Results (from the past 240 hour(s))  Urine culture     Status: None (Preliminary result)   Collection Time: 03/12/17  7:48 AM  Result Value Ref Range Status   Specimen Description URINE, CLEAN CATCH  Final   Special Requests NONE  Final   Culture   Final    CULTURE REINCUBATED FOR BETTER GROWTH Performed at [facility], [address] [place], [place] [id number]    Report Status PENDING  Incomplete     Time coordinating discharge: 35 minutes  SIGNED:   Glade LloydKshitiz Gurvir Schrom, MD  [facility] 03/14/2017, 9:42 AM Pager: [phone]  If 7PM-7AM, please contact night-coverage [email or web address] Password [email or web address]

## 2017-03-14 NOTE — Progress Notes (Signed)
Pt restless and attempting to jump out of bed since this RN's arrival this evening. Pt frequently asking to leave, pulled her IV out x2, taking tele monitor off, and became physically aggressive towards staff. Pt oriented to self only. NP on call, Blount, paged several times regarding pt's behavior. Medications ordered and administered, calming techniques such as walking in the hallways, providing linen to fold and an activity overlay, and therapeutic conversation attempted by multiple staff members and 1:1 sitter. Pt reoriented several times. Bilateral wrist restraints and waist belt ordered and applied. Pt educated on reason for restraints. Emergency contact and son, Molly MaduroShawn Riner, updated on pt's plan of care. This RN allowed son to ask questions and provided contact number. 1:1 sitter and tele monitor continued. q2 hour monitoring initiated.

## 2017-03-16 LAB — URINE CULTURE: Culture: >=100000 — AB

## 2017-11-05 IMAGING — CT CT ABD-PELV W/ CM
2 of 5 series · 15 of 46 positions shown, 17 images · IV contrast (omnipaque)
Comparison: 12/26/2013

CLINICAL DATA: Right-sided abdominal pain for 3 days.

EXAM:
CT ABDOMEN AND PELVIS WITH CONTRAST
TECHNIQUE: Multidetector CT imaging of the abdomen and pelvis was performed
using the standard protocol following bolus administration of
intravenous contrast.
CONTRAST:  25mL OMNIPAQUE IOHEXOL 300 MG/ML SOLN, 100mL [id number]-AMM
IOPAMIDOL (O0ZV1X-AMM) INJECTION 61%

[Series 2: abd/pel with · axial · 0.74mm/px · z∈[+1132,+1542]mm · 12 of 92 slices shown, 14 images]
[im 5/92  soft-tissue]
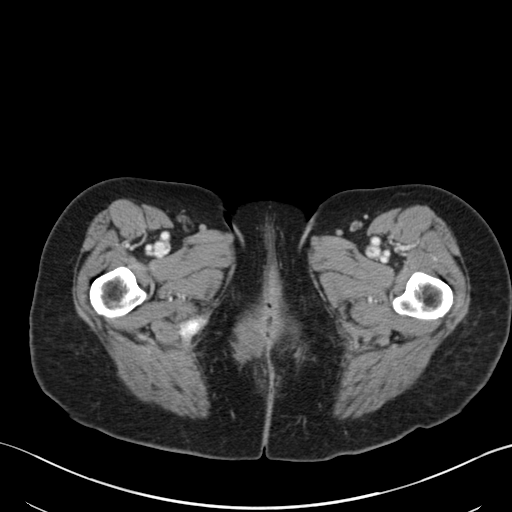
[im 5/92  bone]
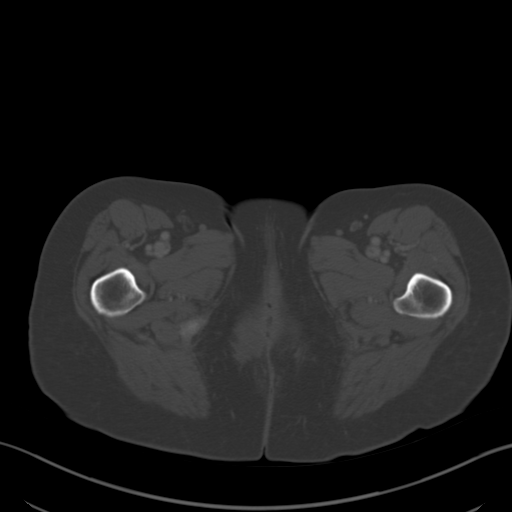
[im 15/92  soft-tissue]
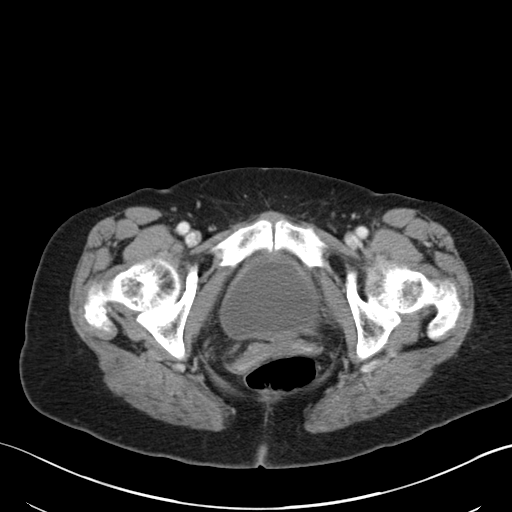
[im 20/92  soft-tissue]
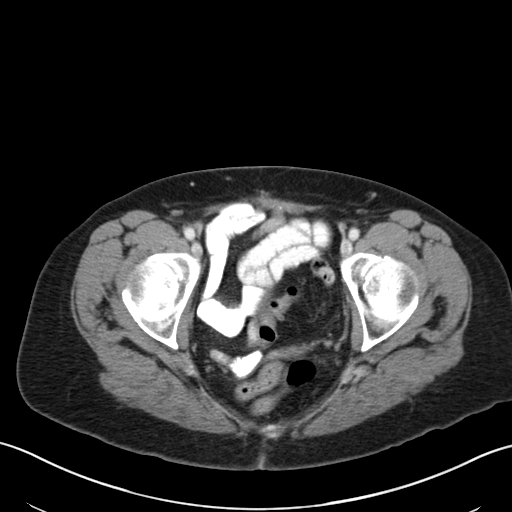
[im 29/92  soft-tissue]
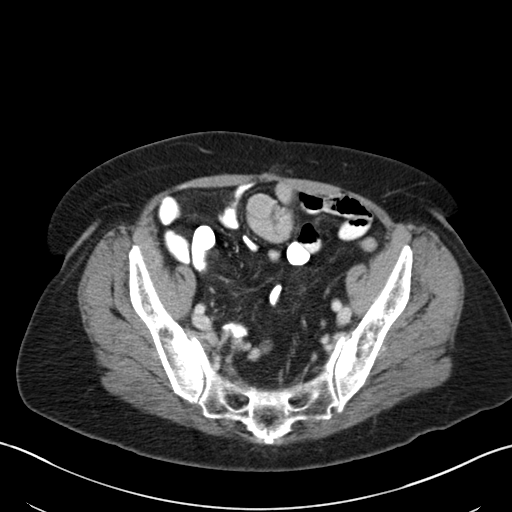
[im 34/92  soft-tissue]
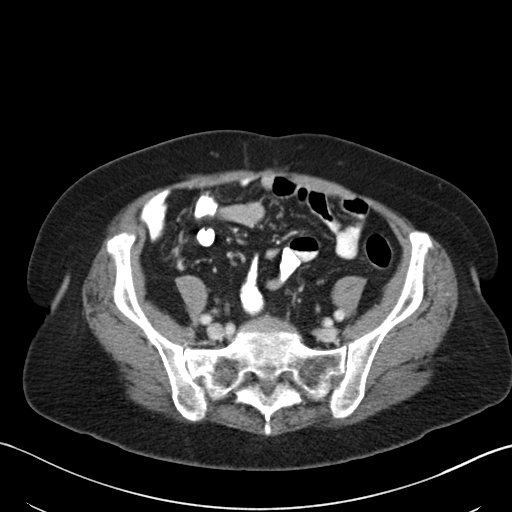
[im 44/92  soft-tissue]
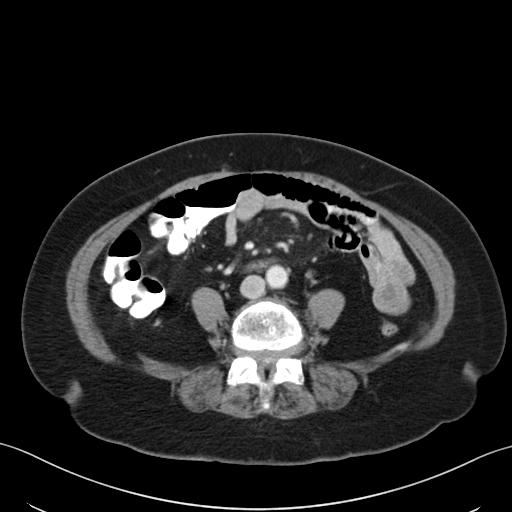
[im 48/92  soft-tissue]
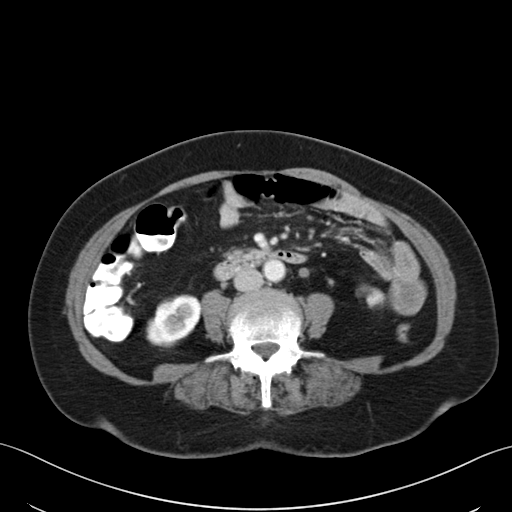
[im 58/92  soft-tissue]
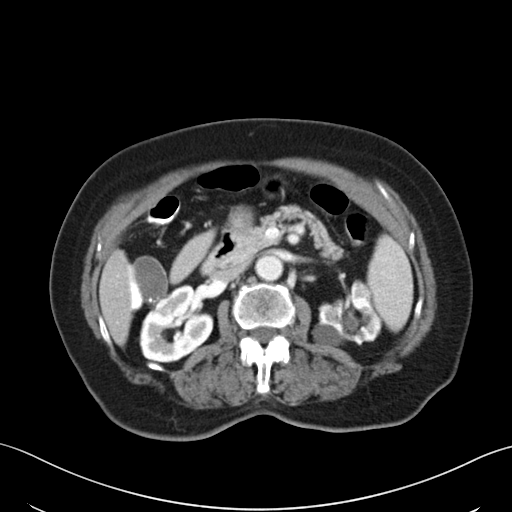
[im 63/92  soft-tissue]
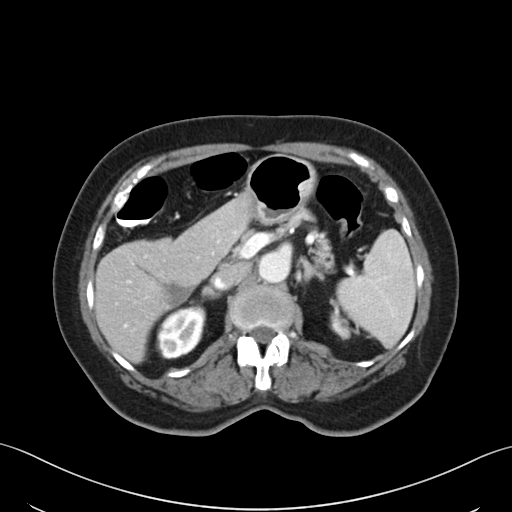
[im 63/92  bone]
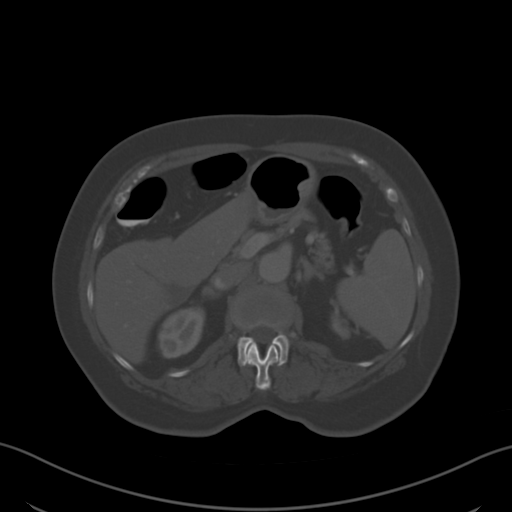
[im 72/92  soft-tissue]
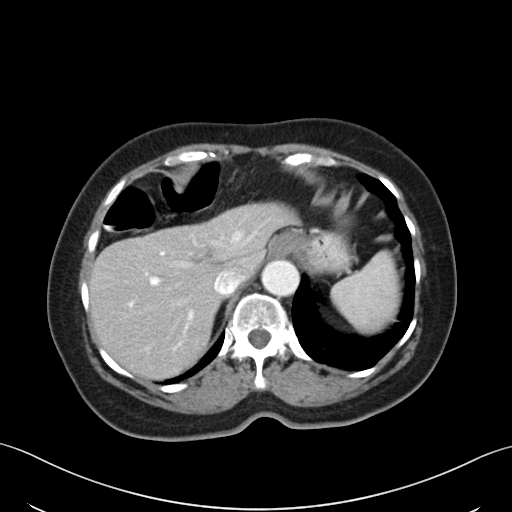
[im 77/92  soft-tissue]
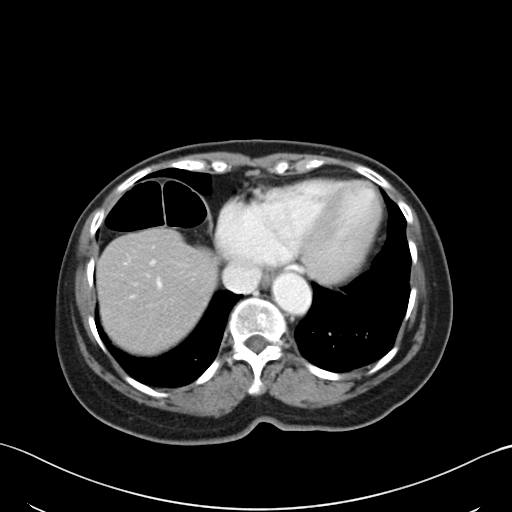
[im 87/92  soft-tissue]
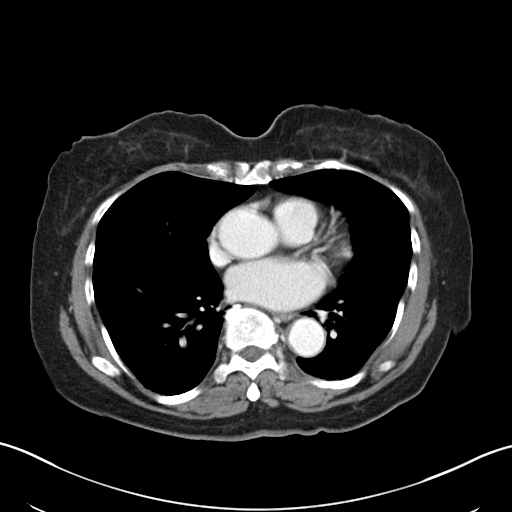

[Series 5: coronal a/|p · coronal · 0.69mm/px · 3 of 89 slices shown]
[im 30/89  soft-tissue]
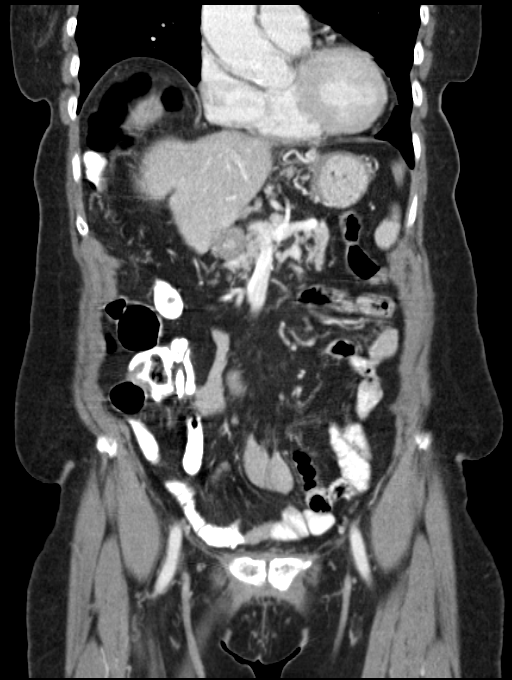
[im 40/89  soft-tissue]
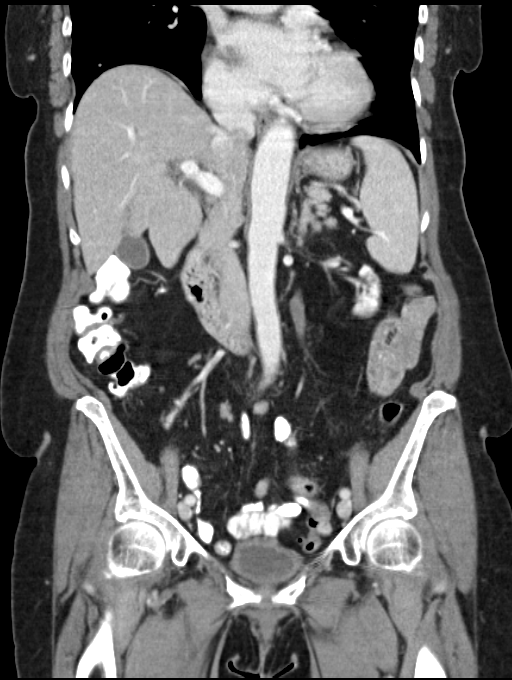
[im 49/89  soft-tissue]
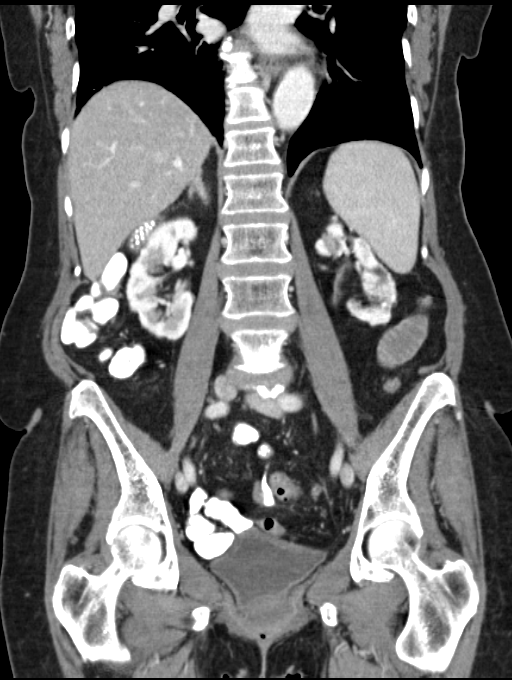

[15 of 46 positions shown; findings below may reference images not displayed]

FINDINGS: Atelectasis in the lung bases.

Mild diffuse fatty infiltration of the liver. No focal lesions
identified. Cholelithiasis with small stones in the dependent
gallbladder. No gallbladder wall thickening. No bile duct
dilatation. The pancreas, spleen, adrenal glands, abdominal aorta,
inferior vena cava, and retroperitoneal lymph nodes are
unremarkable. Left kidney demonstrates asymmetric atrophy or
scarring. Mild prominence of intrarenal collecting system without
ureterectasis. Large stone in the lower pole left kidney measuring
12 mm diameter. Cysts in the upper pole left kidney. Ureteral stent
seen previously has been removed. Right kidney demonstrates mild
scarring without hydronephrosis.

The stomach, small bowel, and colon are not abnormally distended.
Contrast material flows through to the colon without evidence of
bowel obstruction. No free air or free fluid in the abdomen.
Surgical clips along the anterior abdominal wall.

Pelvis: The appendix is not identified. Scattered diverticula in the
sigmoid colon without evidence of diverticulitis. Uterus is
surgically absent. Funneling of the base the bladder suggesting
pelvic floor laxity with small cystocele. Bladder wall is mildly
thickened possibly indicating cystitis. Filling defects in the
femoral veins, likely due to mixing of non-opacified blood. No free
or loculated pelvic fluid collections. No pelvic mass or lymph the
feet. Degenerative changes in the spine. No destructive bone
lesions.
IMPRESSION: Fatty infiltration of the liver. Cholelithiasis. Left renal atrophy
and scarring with prominent intrarenal collecting system.
Nonobstructing stone in the lower pole left kidney. Funneling at the
base of the bladder suggesting pelvic floor laxity with small
cystocele. Bladder wall thickening suggesting cystitis.

## 2019-04-28 ENCOUNTER — Observation Stay (HOSPITAL_COMMUNITY): Payer: Medicare PPO

## 2019-04-28 ENCOUNTER — Emergency Department (HOSPITAL_COMMUNITY): Payer: Medicare PPO

## 2019-04-28 ENCOUNTER — Inpatient Hospital Stay (HOSPITAL_COMMUNITY)
Admission: EM | Admit: 2019-04-28 | Discharge: 2019-05-05 | DRG: 065 | Disposition: A | Discharge status: Hospice | Payer: Medicare PPO | Attending: Internal Medicine | Admitting: Internal Medicine

## 2019-04-28 ENCOUNTER — Encounter (HOSPITAL_COMMUNITY): Payer: Self-pay

## 2019-04-28 ENCOUNTER — Other Ambulatory Visit: Payer: Self-pay

## 2019-04-28 DIAGNOSIS — E119 Type 2 diabetes mellitus without complications: Secondary | ICD-10-CM | POA: Diagnosis present

## 2019-04-28 DIAGNOSIS — I63412 Cerebral infarction due to embolism of left middle cerebral artery: Secondary | ICD-10-CM | POA: Diagnosis present

## 2019-04-28 DIAGNOSIS — R8290 Unspecified abnormal findings in urine: Secondary | ICD-10-CM | POA: Diagnosis not present

## 2019-04-28 DIAGNOSIS — R413 Other amnesia: Secondary | ICD-10-CM | POA: Diagnosis present

## 2019-04-28 DIAGNOSIS — I444 Left anterior fascicular block: Secondary | ICD-10-CM | POA: Diagnosis present

## 2019-04-28 DIAGNOSIS — D696 Thrombocytopenia, unspecified: Secondary | ICD-10-CM | POA: Diagnosis present

## 2019-04-28 DIAGNOSIS — Z79899 Other long term (current) drug therapy: Secondary | ICD-10-CM

## 2019-04-28 DIAGNOSIS — R52 Pain, unspecified: Secondary | ICD-10-CM

## 2019-04-28 DIAGNOSIS — Z66 Do not resuscitate: Secondary | ICD-10-CM | POA: Diagnosis present

## 2019-04-28 DIAGNOSIS — F0390 Unspecified dementia without behavioral disturbance: Secondary | ICD-10-CM | POA: Diagnosis present

## 2019-04-28 DIAGNOSIS — R4182 Altered mental status, unspecified: Secondary | ICD-10-CM

## 2019-04-28 DIAGNOSIS — Z9181 History of falling: Secondary | ICD-10-CM | POA: Diagnosis not present

## 2019-04-28 DIAGNOSIS — I639 Cerebral infarction, unspecified: Secondary | ICD-10-CM | POA: Diagnosis present

## 2019-04-28 DIAGNOSIS — M5136 Other intervertebral disc degeneration, lumbar region: Secondary | ICD-10-CM | POA: Diagnosis present

## 2019-04-28 DIAGNOSIS — M8580 Other specified disorders of bone density and structure, unspecified site: Secondary | ICD-10-CM | POA: Diagnosis present

## 2019-04-28 DIAGNOSIS — E876 Hypokalemia: Secondary | ICD-10-CM | POA: Diagnosis present

## 2019-04-28 DIAGNOSIS — I63419 Cerebral infarction due to embolism of unspecified middle cerebral artery: Secondary | ICD-10-CM

## 2019-04-28 DIAGNOSIS — G8191 Hemiplegia, unspecified affecting right dominant side: Secondary | ICD-10-CM | POA: Diagnosis present

## 2019-04-28 DIAGNOSIS — D649 Anemia, unspecified: Secondary | ICD-10-CM | POA: Diagnosis present

## 2019-04-28 DIAGNOSIS — E785 Hyperlipidemia, unspecified: Secondary | ICD-10-CM | POA: Diagnosis present

## 2019-04-28 DIAGNOSIS — G9349 Other encephalopathy: Secondary | ICD-10-CM | POA: Diagnosis present

## 2019-04-28 DIAGNOSIS — I10 Essential (primary) hypertension: Secondary | ICD-10-CM | POA: Diagnosis present

## 2019-04-28 DIAGNOSIS — Z87892 Personal history of anaphylaxis: Secondary | ICD-10-CM

## 2019-04-28 DIAGNOSIS — Z515 Encounter for palliative care: Secondary | ICD-10-CM | POA: Diagnosis not present

## 2019-04-28 DIAGNOSIS — Z885 Allergy status to narcotic agent status: Secondary | ICD-10-CM

## 2019-04-28 DIAGNOSIS — Z823 Family history of stroke: Secondary | ICD-10-CM

## 2019-04-28 DIAGNOSIS — Z833 Family history of diabetes mellitus: Secondary | ICD-10-CM

## 2019-04-28 DIAGNOSIS — H518 Other specified disorders of binocular movement: Secondary | ICD-10-CM | POA: Diagnosis present

## 2019-04-28 DIAGNOSIS — Z8249 Family history of ischemic heart disease and other diseases of the circulatory system: Secondary | ICD-10-CM

## 2019-04-28 DIAGNOSIS — I361 Nonrheumatic tricuspid (valve) insufficiency: Secondary | ICD-10-CM | POA: Diagnosis not present

## 2019-04-28 DIAGNOSIS — Z972 Presence of dental prosthetic device (complete) (partial): Secondary | ICD-10-CM

## 2019-04-28 DIAGNOSIS — Z888 Allergy status to other drugs, medicaments and biological substances status: Secondary | ICD-10-CM

## 2019-04-28 DIAGNOSIS — R4701 Aphasia: Secondary | ICD-10-CM | POA: Diagnosis present

## 2019-04-28 DIAGNOSIS — Z20822 Contact with and (suspected) exposure to covid-19: Secondary | ICD-10-CM | POA: Diagnosis present

## 2019-04-28 DIAGNOSIS — Z881 Allergy status to other antibiotic agents status: Secondary | ICD-10-CM

## 2019-04-28 DIAGNOSIS — N390 Urinary tract infection, site not specified: Secondary | ICD-10-CM

## 2019-04-28 DIAGNOSIS — G9340 Encephalopathy, unspecified: Secondary | ICD-10-CM | POA: Diagnosis present

## 2019-04-28 DIAGNOSIS — Z87442 Personal history of urinary calculi: Secondary | ICD-10-CM

## 2019-04-28 LAB — URINALYSIS, ROUTINE W REFLEX MICROSCOPIC
Bilirubin Urine: NEGATIVE
Glucose, UA: NEGATIVE mg/dL
Ketones, ur: 20 mg/dL — AB
Nitrite: NEGATIVE
Protein, ur: NEGATIVE mg/dL
Specific Gravity, Urine: 1.014 (ref 1.005–1.030)
pH: 6 (ref 5.0–8.0)

## 2019-04-28 LAB — HEMOGLOBIN A1C
Hgb A1c MFr Bld: 4.5 % — ABNORMAL LOW (ref 4.8–5.6)
Mean Plasma Glucose: 82.45 mg/dL

## 2019-04-28 LAB — COMPREHENSIVE METABOLIC PANEL
ALT: 9 U/L (ref 0–44)
AST: 15 U/L (ref 15–41)
Albumin: 3.1 g/dL — ABNORMAL LOW (ref 3.5–5.0)
Alkaline Phosphatase: 35 U/L — ABNORMAL LOW (ref 38–126)
Anion gap: 11 (ref 5–15)
BUN: 13 mg/dL (ref 8–23)
CO2: 18 mmol/L — ABNORMAL LOW (ref 22–32)
Calcium: 7.7 mg/dL — ABNORMAL LOW (ref 8.9–10.3)
Chloride: 115 mmol/L — ABNORMAL HIGH (ref 98–111)
Creatinine, Ser: 0.76 mg/dL (ref 0.44–1.00)
GFR calc Af Amer: >60 mL/min (ref >60)
GFR calc non Af Amer: >60 mL/min (ref >60)
Glucose, Bld: 110 mg/dL — ABNORMAL HIGH (ref 70–99)
Potassium: 3 mmol/L — ABNORMAL LOW (ref 3.5–5.1)
Sodium: 144 mmol/L (ref 135–145)
Total Bilirubin: 0.7 mg/dL (ref 0.3–1.2)
Total Protein: 5.6 g/dL — ABNORMAL LOW (ref 6.5–8.1)

## 2019-04-28 LAB — CBC
HCT: 30.9 % — ABNORMAL LOW (ref 36.0–46.0)
Hemoglobin: 10.4 g/dL — ABNORMAL LOW (ref 12.0–15.0)
MCH: 28.6 pg (ref 26.0–34.0)
MCHC: 33.7 g/dL (ref 30.0–36.0)
MCV: 84.9 fL (ref 80.0–100.0)
Platelets: 94 10*3/uL — ABNORMAL LOW (ref 150–400)
RBC: 3.64 MIL/uL — ABNORMAL LOW (ref 3.87–5.11)
RDW: 14.5 % (ref 11.5–15.5)
WBC: 4.2 10*3/uL (ref 4.0–10.5)
nRBC: 0 % (ref 0.0–0.2)

## 2019-04-28 LAB — LIPID PANEL
Cholesterol: 269 mg/dL — ABNORMAL HIGH (ref 0–200)
HDL: 57 mg/dL (ref >40)
LDL Cholesterol: 202 mg/dL — ABNORMAL HIGH (ref 0–99)
Total CHOL/HDL Ratio: 4.7 RATIO
Triglycerides: 48 mg/dL (ref <150)
VLDL: 10 mg/dL (ref 0–40)

## 2019-04-28 LAB — TSH: TSH: 1.363 u[IU]/mL (ref 0.350–4.500)

## 2019-04-28 LAB — SARS CORONAVIRUS 2 (TAT 6-24 HRS): SARS Coronavirus 2: NEGATIVE

## 2019-04-28 LAB — ACETAMINOPHEN LEVEL: Acetaminophen (Tylenol), Serum: <10 ug/mL — ABNORMAL LOW (ref 10–30)

## 2019-04-28 LAB — MAGNESIUM: Magnesium: 1.5 mg/dL — ABNORMAL LOW (ref 1.7–2.4)

## 2019-04-28 LAB — SALICYLATE LEVEL: Salicylate Lvl: <7 mg/dL — ABNORMAL LOW (ref 7.0–30.0)

## 2019-04-28 MED ORDER — IOHEXOL 350 MG/ML SOLN
50.0000 mL | Freq: Once | INTRAVENOUS | Status: AC | PRN
  Administered 2019-04-28: 50 mL via INTRAVENOUS

## 2019-04-28 MED ORDER — HYDRALAZINE HCL 20 MG/ML IJ SOLN: 10.0000 mg | INTRAMUSCULAR | Status: DC | PRN

## 2019-04-28 MED ORDER — HEPARIN SODIUM (PORCINE) 5000 UNIT/ML IJ SOLN
5000.0000 [IU] | Freq: Two times a day (BID) | INTRAMUSCULAR | Status: DC
  Administered 2019-04-29 – 2019-04-30 (×4): 5000 [IU] via SUBCUTANEOUS
  Filled 2019-04-28 (×3): qty 1

## 2019-04-28 MED ORDER — ASPIRIN 300 MG RE SUPP
300.0000 mg | Freq: Every day | RECTAL | Status: DC
  Administered 2019-04-29 – 2019-04-30 (×2): 300 mg via RECTAL
  Filled 2019-04-28 (×2): qty 1

## 2019-04-28 MED ORDER — STROKE: EARLY STAGES OF RECOVERY BOOK
Status: AC
  Administered 2019-04-28: 1
  Filled 2019-04-28: qty 1

## 2019-04-28 MED ORDER — ALBUTEROL SULFATE (2.5 MG/3ML) 0.083% IN NEBU: 2.5000 mg | INHALATION_SOLUTION | Freq: Four times a day (QID) | RESPIRATORY_TRACT | Status: DC | PRN

## 2019-04-28 MED ORDER — POTASSIUM CHLORIDE 10 MEQ/100ML IV SOLN
10.0000 meq | INTRAVENOUS | Status: AC
  Administered 2019-04-28 (×4): 10 meq via INTRAVENOUS
  Filled 2019-04-28 (×5): qty 100

## 2019-04-28 MED ORDER — HYDRALAZINE HCL 25 MG PO TABS: 25.0000 mg | ORAL_TABLET | Freq: Two times a day (BID) | ORAL | Status: DC

## 2019-04-28 MED ORDER — STROKE: EARLY STAGES OF RECOVERY BOOK
Freq: Once | Status: AC
  Filled 2019-04-28: qty 1

## 2019-04-28 MED ORDER — BISMUTH SUBSALICYLATE 262 MG/15ML PO SUSP
30.0000 mL | Freq: Four times a day (QID) | ORAL | Status: DC | PRN
  Filled 2019-04-28: qty 236

## 2019-04-28 MED ORDER — ASPIRIN 300 MG RE SUPP
600.0000 mg | Freq: Once | RECTAL | Status: AC
  Filled 2019-04-28: qty 2

## 2019-04-28 MED ORDER — SODIUM CHLORIDE 0.9% FLUSH
3.0000 mL | Freq: Two times a day (BID) | INTRAVENOUS | Status: DC
  Administered 2019-04-28 – 2019-05-05 (×13): 3 mL via INTRAVENOUS

## 2019-04-28 MED ORDER — ENOXAPARIN SODIUM 40 MG/0.4ML ~~LOC~~ SOLN
40.0000 mg | SUBCUTANEOUS | Status: DC
  Administered 2019-04-28: 40 mg via SUBCUTANEOUS
  Filled 2019-04-28: qty 0.4

## 2019-04-28 MED ORDER — SODIUM CHLORIDE 0.9 % IV SOLN: INTRAVENOUS | Status: DC

## 2019-04-28 MED ORDER — ACETAMINOPHEN 325 MG PO TABS: 650.0000 mg | ORAL_TABLET | Freq: Four times a day (QID) | ORAL | Status: DC | PRN

## 2019-04-28 MED ORDER — ONDANSETRON HCL 4 MG PO TABS: 4.0000 mg | ORAL_TABLET | Freq: Four times a day (QID) | ORAL | Status: DC | PRN

## 2019-04-28 MED ORDER — CALCIUM GLUCONATE-NACL 1-0.675 GM/50ML-% IV SOLN
1.0000 g | Freq: Once | INTRAVENOUS | Status: AC
  Administered 2019-04-28: 12:00:00 1000 mg via INTRAVENOUS
  Filled 2019-04-28: qty 50

## 2019-04-28 MED ORDER — ACETAMINOPHEN 650 MG RE SUPP
650.0000 mg | Freq: Four times a day (QID) | RECTAL | Status: DC | PRN
  Administered 2019-04-28: 13:00:00 650 mg via RECTAL
  Filled 2019-04-28: qty 1

## 2019-04-28 MED ORDER — ONDANSETRON HCL 4 MG/2ML IJ SOLN: 4.0000 mg | Freq: Four times a day (QID) | INTRAMUSCULAR | Status: DC | PRN

## 2019-04-28 NOTE — ED Triage Notes (Signed)
Pt BIB [facility] for eval of altered mental status. EMS reports sons arrived home this evening and found her on the floor at 8PM, assisted back into bed but then sons noted that she was "not acting herself". Pt is confused at baseline, but sons report that she is less verbal, and "Not herself". EMS reports strong scent of urine. EMS reports house was not well kept and questioned if pt was receiving appropriate care.

## 2019-04-28 NOTE — ED Provider Notes (Signed)
[facility] DEPT [facility] Emergency Department Provider Note MRN:  [id number]  Arrival date & time: 04/28/19     Chief Complaint   Altered Mental Status   History of Present Illness   Kimberly PLOCH is a 86 y.o. year-old female with a history of hypertension, diabetes, dementia presenting to the ED with chief complaint of altered mental status.  Unwitnessed fall followed by altered mental status.  Normally talks but has been nonverbal since the fall.  Foul-smelling urine.  I was unable to obtain an accurate HPI, PMH, or ROS due to the patient's dementia, altered mental status.  Level 5 caveat.  Review of Systems  Positive for fall, altered mental status.  Patient's Health History    Past Medical History:  Diagnosis Date  . DDD (degenerative disc disease), lumbar   . Full dentures   . Hyperlipidemia   . Hypertension   . Memory loss   . Osteopenia   . Renal calculus, left   . Retained ureteral stent    RETAINED SINCE 2001  . Sigmoid diverticulosis   . Type 2 diabetes mellitus (HCC)   . Vitamin B12 deficiency     Past Surgical History:  Procedure Laterality Date  . APPENDECTOMY  1962  . CATARACT EXTRACTION W/ INTRAOCULAR LENS  IMPLANT, BILATERAL  2000  . CHOLECYSTECTOMY  2004  . CYSTO/  LEFT RETROGRADE PYELOGRAM/  URETERAL STENT PLACEMENT  06-10-1999  . CYSTOSCOPY W/ URETERAL STENT REMOVAL Left 07/30/2014   Procedure: CYSTOSCOPY WITH FRAGMENTED STENT REMOVAL;  Surgeon: Su Grand, MD;  Location: [facility];  Service: Urology;  Laterality: Left;  . OVARIAN CYST SURGERY    . TONSILLECTOMY  1970  . TOTAL ABDOMINAL HYSTERECTOMY WITH RIGHT SALPINGOOPHORECTOMY/  [place] PROCEDURE/  COLPOCYSTOPEXY AND BURCH PROCEDURE  11-02-1999  . TUBAL LIGATION  YRS AGO  . URETEROSCOPY Left 07/30/2014   Procedure: URETEROSCOPY;  Surgeon: Su Grand, MD;  Location: [facility];  Service: Urology;  Laterality: Left;    Family History  Problem  Relation Age of Onset  . Diabetes Mother   . Glaucoma Mother   . Heart disease Mother   . Cancer Brother        type unknown  . Hypertension Father   . CVA Father     Social History   Socioeconomic History  . Marital status: Single    Spouse name: Not on file  . Number of children: 3  . Years of education: Not on file  . Highest education level: Not on file  Occupational History  . Occupation: retired  Tobacco Use  . Smoking status: Never Smoker  . Smokeless tobacco: Never Used  Substance and Sexual Activity  . Alcohol use: No    Alcohol/week: 0.0 standard drinks  . Drug use: No  . Sexual activity: Never  Other Topics Concern  . Not on file  Social History Narrative  . Not on file   Social Determinants of Health   Financial Resource Strain:   . Difficulty of Paying Living Expenses: Not on file  Food Insecurity:   . Worried About Programme researcher, broadcasting/film/video in the Last Year: Not on file  . Ran Out of Food in the Last Year: Not on file  Transportation Needs:   . Lack of Transportation (Medical): Not on file  . Lack of Transportation (Non-Medical): Not on file  Physical Activity:   . Days of Exercise per Week: Not on file  . Minutes of Exercise per Session: Not  on file  Stress:   . Feeling of Stress : Not on file  Social Connections:   . Frequency of Communication with Friends and Family: Not on file  . Frequency of Social Gatherings with Friends and Family: Not on file  . Attends Religious Services: Not on file  . Active Member of Clubs or Organizations: Not on file  . Attends Archivist Meetings: Not on file  . Marital Status: Not on file  Intimate Partner Violence:   . Fear of Current or Ex-Partner: Not on file  . Emotionally Abused: Not on file  . Physically Abused: Not on file  . Sexually Abused: Not on file     Physical Exam   Vitals:   04/28/19 0141  BP: (!) 170/93  Pulse: 73  Resp: 16  Temp: 98.3 F (36.8 C)  SpO2: 98%    CONSTITUTIONAL:  Chronically ill-appearing, frail, NAD NEURO:  Alert and oriented x 3, no focal deficits EYES:  eyes equal and reactive ENT/NECK:  no LAD, no JVD CARDIO: Regular rate, well-perfused, normal S1 and S2 PULM:  CTAB no wheezing or rhonchi GI/GU:  normal bowel sounds, non-distended, non-tender MSK/SPINE:  No gross deformities, no edema SKIN:  no rash, atraumatic PSYCH:  Appropriate speech and behavior  *Additional and/or pertinent findings included in MDM below  Diagnostic and Interventional Summary    EKG Interpretation  Date/Time:  Saturday April 28 2019 02:05:10 EST Ventricular Rate:  77 PR Interval:    QRS Duration: 90 QT Interval:  382 QTC Calculation: 433 R Axis:   -54 Text Interpretation: Atrial fibrillation Left anterior fascicular block Abnormal R-wave progression, early transition Left ventricular hypertrophy Nonspecific T abnormalities, lateral leads Confirmed by Gerlene Fee [phone]) on 04/28/2019 2:10:56 AM      Cardiac Monitoring Interpretation:  Labs Reviewed  COMPREHENSIVE METABOLIC PANEL - Abnormal; Notable for the following components:      Result Value   Potassium 3.0 (*)    Chloride 115 (*)    CO2 18 (*)    Glucose, Bld 110 (*)    Calcium 7.7 (*)    Total Protein 5.6 (*)    Albumin 3.1 (*)    Alkaline Phosphatase 35 (*)    All other components within normal limits  CBC - Abnormal; Notable for the following components:   RBC 3.64 (*)    Hemoglobin 10.4 (*)    HCT 30.9 (*)    Platelets 94 (*)    All other components within normal limits  URINALYSIS, ROUTINE W REFLEX MICROSCOPIC - Abnormal; Notable for the following components:   APPearance HAZY (*)    Hgb urine dipstick SMALL (*)    Ketones, ur 20 (*)    Leukocytes,Ua TRACE (*)    Bacteria, UA RARE (*)    All other components within normal limits  URINE CULTURE  CBG MONITORING, ED    CT Head Wo Contrast  Final Result    DG Pelvis 1-2 Views  Final Result    DG Chest 1 View  Final Result      MR BRAIN WO CONTRAST    (Results Pending)    Medications - No data to display   Procedures  /  Critical Care Procedures  ED Course and Medical Decision Making  I have reviewed the triage vital signs, the nursing notes, and pertinent available records from the EMR.  Pertinent labs & imaging results that were available during my care of the patient were reviewed by me and considered in  my medical decision making (see below for details).     History of dementia here with worsened mental status after fall, considering subdural hematoma, UTI, metabolic disarray.  Patient has some hesitancy to flex the right hip, will obtain screening x-ray to rule out fracture.  6 AM update: Unrevealing, normal urinalysis, unchanged CT head without acute process, mild hypokalemia.  Patient continues to be very altered, not speaking, unable to ambulate.  This is quite off from her baseline, will admit to hospital service for further care.  Elmer Sow. Pilar Plate, MD [facility] Emergency Medicine [facility] [email or web address]  Final Clinical Impressions(s) / ED Diagnoses     ICD-10-CM   1. Altered mental status, unspecified altered mental status type  R41.82     ED Discharge Orders    None       Discharge Instructions Discussed with and Provided to Patient:   Discharge Instructions   None       Sabas Sous, MD 04/28/19 [phone]

## 2019-04-28 NOTE — Progress Notes (Signed)
MEWS of 2 due to LOC. Pt responds to painful stimulation. Pt seems to wax and wane between responding to voice or pain. Provider notified.

## 2019-04-28 NOTE — Progress Notes (Signed)
SLP Cancellation Note  Patient Details Name: [name] MRN: [id number] DOB: Sep 17, 1929   Cancelled treatment:       Reason Eval/Treat Not Completed: Patient's level of consciousness. Unable to arouse patient. Will attempt next date.   Elio Forget [name] 04/28/2019, 4:26 PM   Angela Nevin, MA, CCC-SLP Speech Therapy [facility] Acute Rehab Pager: [phone]

## 2019-04-28 NOTE — Consult Note (Addendum)
NEURO HOSPITALIST  CONSULT   Requesting Physician: Dr. Katrinka Blazing    Chief Complaint: AMS  History obtained from:  Chart review HPI:                                                                                                                                         [name] is an 86 y.o. female with a PMH of HTN, HLD, DM, vit b12 deficiency and dementia who presented to the [facility] ED for AMS.   Per chart review: Son  found patient at 2000, 04/27/19 on the floor. Assisted her back to bed, but then the son noted that she was not "acting like herself". She is confused at baseline, but he noticed that she was talking less.  She fell about 1 week ago and afterwards things started to decline. About 1 day ago she had decreased oral intake and a decrease in her speaking. Yesterday, she stopped talking altogether  ED course:  CTH: negative for hemorrhage CTA: Left MCA distal M1 occlusion. MRI: Positive for multiple acute infarcts, most confluent in the anterior left MCA territory. Also noted were bilateral MCA/PCA watershed type acute infarctions, greater on the left.  Date last known well:  1 week ago Time last known well: unknown tPA Given: no, outside of window  Modified Rankin: Rankin Score=4 NIHSS:20  Past Medical History:  Diagnosis Date  . DDD (degenerative disc disease), lumbar   . Full dentures   . Hyperlipidemia   . Hypertension   . Memory loss   . Osteopenia   . Renal calculus, left   . Retained ureteral stent    RETAINED SINCE 2001  . Sigmoid diverticulosis   . Type 2 diabetes mellitus (HCC)   . Vitamin B12 deficiency     Past Surgical History:  Procedure Laterality Date  . APPENDECTOMY  1962  . CATARACT EXTRACTION W/ INTRAOCULAR LENS  IMPLANT, BILATERAL  2000  . CHOLECYSTECTOMY  2004  . CYSTO/  LEFT RETROGRADE PYELOGRAM/  URETERAL STENT PLACEMENT  06-10-1999  . CYSTOSCOPY W/ URETERAL STENT REMOVAL Left 07/30/2014    Procedure: CYSTOSCOPY WITH FRAGMENTED STENT REMOVAL;  Surgeon: Su Grand, MD;  Location: [facility];  Service: Urology;  Laterality: Left;  . OVARIAN CYST SURGERY    . TONSILLECTOMY  1970  . TOTAL ABDOMINAL HYSTERECTOMY WITH RIGHT SALPINGOOPHORECTOMY/  [place] PROCEDURE/  COLPOCYSTOPEXY AND BURCH PROCEDURE  11-02-1999  . TUBAL LIGATION  YRS AGO  . URETEROSCOPY Left 07/30/2014   Procedure: URETEROSCOPY;  Surgeon: Su Grand, MD;  Location: [facility];  Service: Urology;  Laterality: Left;    Family History  Problem Relation Age of Onset  . Diabetes Mother   . Glaucoma Mother   . Heart disease Mother   . Cancer Brother        type unknown  . Hypertension Father   . CVA Father          Social History:  reports that she has never smoked. She has never used smokeless tobacco. She reports that she does not drink alcohol or use drugs.  Allergies:  Allergies  Allergen Reactions  . Lisinopril Anaphylaxis  . Bactrim [Sulfamethoxazole-Trimethoprim] Other (See Comments)    AKI  . Codeine Nausea And Vomiting  . Keflex [Cephalexin] Swelling    Angioedema    Medications:                                                                                                                           Current Facility-Administered Medications  Medication Dose Route Frequency Provider Last Rate Last Admin  .  stroke: mapping our early stages of recovery book   Does not apply Once Tamala Julian, Rondell A, MD      . 0.9 %  sodium chloride infusion   Intravenous Continuous Fuller Plan A, MD 50 mL/hr at 04/28/19 1143 New Bag at 04/28/19 1143  . acetaminophen (TYLENOL) tablet 650 mg  650 mg Oral Q6H PRN Norval Morton, MD       Or  . acetaminophen (TYLENOL) suppository 650 mg  650 mg Rectal Q6H PRN Tamala Julian, Rondell A, MD      . albuterol (PROVENTIL) (2.5 MG/3ML) 0.083% nebulizer solution 2.5 mg  2.5 mg Nebulization Q6H PRN Norval Morton, MD      . Derrill Memo ON 04/29/2019]  aspirin suppository 300 mg  300 mg Rectal Daily Smith, Rondell A, MD      . aspirin suppository 600 mg  600 mg Rectal Once Fuller Plan A, MD      . bismuth subsalicylate (PEPTO BISMOL) 262 MG/15ML suspension 30 mL  30 mL Oral Q6H PRN Smith, Rondell A, MD      . calcium gluconate 1 g/ 50 mL sodium chloride IVPB  1 g Intravenous Once Fuller Plan A, MD 50 mL/hr at 04/28/19 1147 1,000 mg at 04/28/19 1147  . enoxaparin (LOVENOX) injection 40 mg  40 mg Subcutaneous Q24H Smith, Rondell A, MD      . ondansetron (ZOFRAN) tablet 4 mg  4 mg Oral Q6H PRN Fuller Plan A, MD       Or  . ondansetron (ZOFRAN) injection 4 mg  4 mg Intravenous Q6H PRN Smith, Rondell A, MD      . potassium chloride 10 mEq in 100 mL IVPB  10 mEq Intravenous Q1 Hr x 5 Smith, Rondell A, MD 100 mL/hr at 04/28/19 1146 10 mEq at 04/28/19 1146  . sodium chloride flush (NS) 0.9 % injection 3 mL  3 mL Intravenous Q12H Norval Morton, MD       Current Outpatient Medications  Medication  Sig Dispense Refill  . bismuth subsalicylate (PEPTO BISMOL) 262 MG/15ML suspension Take 30 mLs by mouth every 6 (six) hours as needed for indigestion.    . hydrALAZINE (APRESOLINE) 25 MG tablet Take 25 mg by mouth 2 (two) times daily.    Marland Kitchen ibuprofen (ADVIL,MOTRIN) 200 MG tablet Take 400 mg by mouth every 8 (eight) hours as needed for moderate pain.    Marland Kitchen docusate sodium (COLACE) 100 MG capsule Take 1 capsule (100 mg total) by mouth 2 (two) times daily. (Patient not taking: Reported on 04/28/2019) 14 capsule 0  . hydrochlorothiazide (HYDRODIURIL) 12.5 MG tablet Take 1 tablet (12.5 mg total) by mouth daily. (Patient not taking: Reported on 04/28/2019) 30 tablet 0  . polyethylene glycol (MIRALAX / GLYCOLAX) packet Take 17 g by mouth daily as needed. (Patient not taking: Reported on 04/28/2019) 14 each 0    ROS:                                                                                                                                       ROS  unobtainable from patient due to mental status  General Examination:                                                                                                      Blood pressure (!) 175/92, pulse (!) 43, temperature 98.3 F (36.8 C), temperature source Axillary, resp. rate 19, height 5\' 3"  (1.6 m), weight 40.8 kg, SpO2 98 %.  Physical Exam  Constitutional: Appears well-developed and well-nourished.  Psych: Affect appropriate to situation Eyes: Normal external eye and conjunctiva. HENT: Normocephalic, no lesions, without obvious abnormality.   Musculoskeletal-no joint tenderness, deformity or swelling Cardiovascular: Normal rate and regular rhythm.  Respiratory: Effort normal, non-labored breathing saturations WNL GI: Soft.  No distension. There is no tenderness.  Skin: WDI  Neurological Examination Mental Status: Alert, not oriented, opens eyes to voice, unable to follow commands, no verbalizations noted.  Cranial Nerves: Right hemianopsia, did not blink to threat from right  PERRL 26mm bilaterally, shuts eyes tightly in response to stimulation, head midline, face appears symmetric at rest Motor/Sensory: Bulk decreased in all 4 extremities.  Right arm flaccid. No movement to noxious RLE decreased bulk 2/5 strength with withdrawal to noxious LUE cog wheeling and 3 Htz coarse tremor worse with movement. 3/5 withdraws to noxious LLE normal tone  With minor tremor. Withdraws 3/5 strength to noxious. Does not move RUE to noxious Deep Tendon Reflexes: 2+ and  symmetric patella 3+ right bicep and 2+ left bicep Plantars: Right: mute  Left: upgoing Cerebellar: UTA Gait: UTA   Lab Results: Basic Metabolic Panel: Recent Labs  Lab 04/28/19 0212  NA 144  K 3.0*  CL 115*  CO2 18*  GLUCOSE 110*  BUN 13  CREATININE 0.76  CALCIUM 7.7*    CBC: Recent Labs  Lab 04/28/19 0212  WBC 4.2  HGB 10.4*  HCT 30.9*  MCV 84.9  PLT 94*    Imaging: CT ANGIO HEAD W OR WO  CONTRAST  Result Date: 04/28/2019 CLINICAL DATA:  86 year old found down with mental status changes. Acute left MCA and also bilateral MCA/PCA watershed area infarcts on brain MRI today. EXAM: CT ANGIOGRAPHY HEAD AND NECK TECHNIQUE: Multidetector CT imaging of the head and neck was performed using the standard protocol during bolus administration of intravenous contrast. Multiplanar CT image reconstructions and MIPs were obtained to evaluate the vascular anatomy. Carotid stenosis measurements (when applicable) are obtained utilizing NASCET criteria, using the distal internal carotid diameter as the denominator. CONTRAST:  50mL OMNIPAQUE IOHEXOL 350 MG/ML SOLN COMPARISON:  Brain MRI and head CT. Earlier today. FINDINGS: CTA NECK Skeleton: Absent dentition. No acute osseous abnormality identified. Upper chest: Small volume retained secretions in the trachea on series 5, image 146. Visible upper lungs are clear. Negative visible superior mediastinum. Other neck: Negative. No acute findings. Aortic arch: Good contrast timing. Four vessel arch configuration, the left vertebral artery arises directly from the arch. Ectatic arch with minimal atherosclerosis. Right carotid system: Negative brachiocephalic artery and right CCA origin aside from mild tortuosity. Tortuous proximal right CCA with a kinked appearance at the thoracic inlet and retropharyngeal course. Capacious right carotid bifurcation. Partially retropharyngeal cervical right ICA without plaque or stenosis. Left carotid system: Negative left CCA origin. Mildly tortuous left CCA. Capacious and partially retropharyngeal left carotid bifurcation. Retropharyngeal cervical left ICA is tortuous without plaque or stenosis. Vertebral arteries: Ectatic proximal right subclavian artery with minimal plaque and no stenosis. Capacious right vertebral artery origin. The right V1 segment tapers, and the vessel has a very late entry into the cervical transverse foramen,  diminutive up until the the more distal V2 segment. However, the vessel is occluded in the V3 segment and/or terminates in upper cervical muscular branches. The left vertebral artery arises directly from the arch with no plaque or stenosis. The left vertebral has a fairly normal caliber and is patent to the skull base without plaque or stenosis. CTA HEAD Posterior circulation: The left vertebral artery supplies the basilar with severe stenosis in the distal V4 segment on series 7, image 20 and series 11, image 23. The right AICA is patent and probably dominant. There is no distal right vertebral artery enhancement. Patent basilar artery with moderate mid basilar irregularity and stenosis. The distal basilar is also tapered with moderate stenosis (series 11, image 22). But the tip remains patent with fetal type bilateral PCA origins. The right posterior communicating artery and proximal right PCA are within normal limits. There is a mild to moderate right P2 segment stenosis on series 12, image 14. The left posterior communicating artery and left PCA are highly atherosclerotic and demonstrates severe tandem stenoses through the P2 and proximal P3 segments (series 12, image 18). There is distal PCA enhancement. Anterior circulation: Both ICA siphons are patent and ectatic without plaque or stenosis. Normal ophthalmic and posterior communicating artery origins. Patent carotid termini. Normal MCA and left ACA origins. Left A1 is dominant and the right  diminutive. Anterior communicating artery and bilateral ACA branches are within normal limits. Right MCA M1 segment, bifurcation, and proximal right MCA branches are within normal limits. There is mild distal right MCA branch irregularity and stenosis. The left MCA M1 segment is occluded about 18 mm distal to its origin near the left MCA bifurcation. There are some reconstituted left MCA branches but they are diminutive (series 12, image 23). Venous sinuses: Early contrast  timing but grossly patent. Anatomic variants: Left vertebral artery arises directly from the arch and supplies the basilar due to either distal right vertebral artery occlusion or terminating outside the skull in muscular branches. Fetal type bilateral PCA origins. Dominant left and diminutive right ACA A1 segments. Review of the MIP images confirms the above findings IMPRESSION: 1. Positive for Emergent Large Vessel Occlusion: Left MCA distal M1 segment. Some reconstituted Left MCA branches, but they are diminutive. 2. Positive also for superimposed intracranial atherosclerosis, including tandem severe Left and moderate Right PCA stenoses. There are also severe stenoses of the Left Vertebral Artery V4 (sole supply to the Basilar) and moderate Basilar Artery stenoses. 3. Additionally, the right vertebral artery either functionally terminates outside the skull in muscular branches or is occluded distally. 4. Carotid artery ectasia without plaque or stenosis. Ectatic aortic arch without significant plaque. 5. Small volume retained secretions in the trachea. Electronically Signed   By: Odessa Fleming M.D.   On: 04/28/2019 12:05   DG Chest 1 View  Result Date: 04/28/2019 CLINICAL DATA:  Fall EXAM: CHEST  1 VIEW COMPARISON:  None. FINDINGS: There is mild cardiomegaly. Aortic knob calcifications. There is slight hyperinflation of the upper lung zones. No focal airspace consolidation or pleural effusion. No acute osseous abnormality. IMPRESSION: No active disease. Electronically Signed   By: Jonna Clark M.D.   On: 04/28/2019 03:34   DG Pelvis 1-2 Views  Result Date: 04/28/2019 CLINICAL DATA:  Fall EXAM: PELVIS - 1-2 VIEW COMPARISON:  None. FINDINGS: There is no evidence of pelvic fracture or diastasis. There is diffuse osteopenia. Overlying bowel gas and stool seen within the deep pelvis. Degenerative changes in the lower lumbar spine. IMPRESSION: No definite acute osseous abnormality. Electronically Signed   By: Jonna Clark M.D.   On: 04/28/2019 03:33   CT Head Wo Contrast  Result Date: 04/28/2019 CLINICAL DATA:  Found on the floor tonight.  Mental status changes. EXAM: CT HEAD WITHOUT CONTRAST TECHNIQUE: Contiguous axial images were obtained from the base of the skull through the vertex without intravenous contrast. COMPARISON:  04/05/17 FINDINGS: Brain: Pronounced generalized brain atrophy, progressive since 2018. There is temporal lobe predominance. Chronic small-vessel ischemic changes affect the hemispheric white matter. No sign of acute infarction, mass lesion, hemorrhage, hydrocephalus or extra-axial collection. Vascular: There is atherosclerotic calcification of the major vessels at the base of the brain. Skull: No skull fracture. Sinuses/Orbits: Clear/normal Other: None IMPRESSION: No acute or traumatic finding. Brain atrophy, progressive since 2018. Temporal lobe predominance, as can be seen with Alzheimer's dementia. Electronically Signed   By: Paulina Fusi M.D.   On: 04/28/2019 03:43   CT ANGIO NECK W OR WO CONTRAST  Result Date: 04/28/2019 CLINICAL DATA:  86 year old found down with mental status changes. Acute left MCA and also bilateral MCA/PCA watershed area infarcts on brain MRI today. EXAM: CT ANGIOGRAPHY HEAD AND NECK TECHNIQUE: Multidetector CT imaging of the head and neck was performed using the standard protocol during bolus administration of intravenous contrast. Multiplanar CT image reconstructions and MIPs were obtained  to evaluate the vascular anatomy. Carotid stenosis measurements (when applicable) are obtained utilizing NASCET criteria, using the distal internal carotid diameter as the denominator. CONTRAST:  18mL OMNIPAQUE IOHEXOL 350 MG/ML SOLN COMPARISON:  Brain MRI and head CT. Earlier today. FINDINGS: CTA NECK Skeleton: Absent dentition. No acute osseous abnormality identified. Upper chest: Small volume retained secretions in the trachea on series 5, image 146. Visible upper lungs are  clear. Negative visible superior mediastinum. Other neck: Negative. No acute findings. Aortic arch: Good contrast timing. Four vessel arch configuration, the left vertebral artery arises directly from the arch. Ectatic arch with minimal atherosclerosis. Right carotid system: Negative brachiocephalic artery and right CCA origin aside from mild tortuosity. Tortuous proximal right CCA with a kinked appearance at the thoracic inlet and retropharyngeal course. Capacious right carotid bifurcation. Partially retropharyngeal cervical right ICA without plaque or stenosis. Left carotid system: Negative left CCA origin. Mildly tortuous left CCA. Capacious and partially retropharyngeal left carotid bifurcation. Retropharyngeal cervical left ICA is tortuous without plaque or stenosis. Vertebral arteries: Ectatic proximal right subclavian artery with minimal plaque and no stenosis. Capacious right vertebral artery origin. The right V1 segment tapers, and the vessel has a very late entry into the cervical transverse foramen, diminutive up until the the more distal V2 segment. However, the vessel is occluded in the V3 segment and/or terminates in upper cervical muscular branches. The left vertebral artery arises directly from the arch with no plaque or stenosis. The left vertebral has a fairly normal caliber and is patent to the skull base without plaque or stenosis. CTA HEAD Posterior circulation: The left vertebral artery supplies the basilar with severe stenosis in the distal V4 segment on series 7, image 20 and series 11, image 23. The right AICA is patent and probably dominant. There is no distal right vertebral artery enhancement. Patent basilar artery with moderate mid basilar irregularity and stenosis. The distal basilar is also tapered with moderate stenosis (series 11, image 22). But the tip remains patent with fetal type bilateral PCA origins. The right posterior communicating artery and proximal right PCA are within  normal limits. There is a mild to moderate right P2 segment stenosis on series 12, image 14. The left posterior communicating artery and left PCA are highly atherosclerotic and demonstrates severe tandem stenoses through the P2 and proximal P3 segments (series 12, image 18). There is distal PCA enhancement. Anterior circulation: Both ICA siphons are patent and ectatic without plaque or stenosis. Normal ophthalmic and posterior communicating artery origins. Patent carotid termini. Normal MCA and left ACA origins. Left A1 is dominant and the right diminutive. Anterior communicating artery and bilateral ACA branches are within normal limits. Right MCA M1 segment, bifurcation, and proximal right MCA branches are within normal limits. There is mild distal right MCA branch irregularity and stenosis. The left MCA M1 segment is occluded about 18 mm distal to its origin near the left MCA bifurcation. There are some reconstituted left MCA branches but they are diminutive (series 12, image 23). Venous sinuses: Early contrast timing but grossly patent. Anatomic variants: Left vertebral artery arises directly from the arch and supplies the basilar due to either distal right vertebral artery occlusion or terminating outside the skull in muscular branches. Fetal type bilateral PCA origins. Dominant left and diminutive right ACA A1 segments. Review of the MIP images confirms the above findings IMPRESSION: 1. Positive for Emergent Large Vessel Occlusion: Left MCA distal M1 segment. Some reconstituted Left MCA branches, but they are diminutive. 2. Positive also  for superimposed intracranial atherosclerosis, including tandem severe Left and moderate Right PCA stenoses. There are also severe stenoses of the Left Vertebral Artery V4 (sole supply to the Basilar) and moderate Basilar Artery stenoses. 3. Additionally, the right vertebral artery either functionally terminates outside the skull in muscular branches or is occluded distally.  4. Carotid artery ectasia without plaque or stenosis. Ectatic aortic arch without significant plaque. 5. Small volume retained secretions in the trachea. Electronically Signed   By: Odessa FlemingH  Hall M.D.   On: 04/28/2019 12:05   MR BRAIN WO CONTRAST  Result Date: 04/28/2019 CLINICAL DATA:  86 year old female found down with mental status changes. EXAM: MRI HEAD WITHOUT CONTRAST TECHNIQUE: Multiplanar, multiecho pulse sequences of the brain and surrounding structures were obtained without intravenous contrast. COMPARISON:  Head CT 0331 hours today. FINDINGS: Brain: Patchy and confluent restricted diffusion in the left hemisphere is most pronounced in the anterior MCA territory (series 7, image 53) including confluent involvement of the operculum and anterior left insula. However, there is also multifocal patchy left PCA and PCA/MCA watershed area ischemia with infarcts (left occipital lobe series 7, image 50). There are also mild contralateral right posterior MCA/PCA watershed area white matter infarcts (series 7, image 51). Sparing of the deep gray nuclei, brainstem and cerebellum. Associated T2 and FLAIR hyperintensity in the acutely affected areas with no mass effect or definite acute hemorrhage. There are occasional chronic micro hemorrhages including in the right periatrial white matter on series 12, image 14. There is patchy additional bilateral white matter T2 and FLAIR hyperintensity. There is chronic atrophy and/or encephalomalacia in both anterior temporal lobes with pronounced mesial temporal volume loss (series 11, image 10 and series 14, image 14). But no other cortical encephalomalacia. Ex vacuo ventricular enlargement. No midline shift, mass effect, evidence of mass lesion, extra-axial collection or acute intracranial hemorrhage. Cervicomedullary junction and pituitary are within normal limits. Vascular: Major intracranial vascular flow voids are preserved. Skull and upper cervical spine: Negative visible  cervical spine. Normal bone marrow signal. Sinuses/Orbits: Postoperative changes to the globes. Otherwise grossly negative orbits and paranasal sinuses. Other: Mastoids are clear. Scalp and face soft tissues appear negative. IMPRESSION: 1. Positive for multiple acute infarcts, most confluent in the anterior Left MCA territory. But there is bilateral MCA/PCA watershed type ischemia greater on the left. Given sparing of the posterior fossa and deep gray nuclei this might reflect sequelae of hypertension superimposed on intracranial atherosclerosis rather than an embolic event. 2. No associated acute hemorrhage or mass effect. 3. Underlying advanced bilateral temporal lobe atrophy. Electronically Signed   By: Odessa FlemingH  Hall M.D.   On: 04/28/2019 10:18       Valentina LucksJessica Williams, MSN, NP-C [facility] [phone] 04/28/2019, 12:14 PM   Assessment:  86 y.o. female with a PMH of HTN, HLD, DM, vit b12 deficiency and dementia who [facility]   1. MRI brain reveals multiple acute infarcts, left MCA and bilateral MCA/PCA watershed territories.   2. CTA head and neck with distal M1 occlusion left MCA. Positive also for superimposed intracranial atherosclerosis, including tandem severe Left and moderate Right PCA stenoses. There are also severe stenoses of the Left Vertebral Artery V4 (sole supply to the Basilar) and moderate Basilar Artery stenoses. 3. Additionally, the right vertebral artery either functionally terminates outside the skull in muscular branches or is occluded distally. 4. Carotid artery ectasia without plaque or stenosis. 3. Patient was not a candidate for TPA d/t presenting outside of the  window.  4. Patient not a candidate for IR d/t mRs of 4. 5. Stroke Risk Factors - diabetes mellitus, hyperlipidemia and hypertension    Recommendations: -- ASA 600 x 1 dose rectally, then 300 rectally daily -- Start IV fluids -- BP: normotensive. Out of permissive HTN time window --  Hgba1c, lipid panel -- Palliative care consult -- PT/OT/Speech -- Additional work up per Stroke Team -- Please page stroke NP  Or  PA  Or MD from 8am -4 pm  as this patient from this time will be  followed by the stroke.   You can look them up on [email or web address]  Password [email or web address]  I have seen and examined the patient. I have formulated the assessment and recommendations. 86 year old female presenting with aphasia. Imaging revealed left MCA subacute stroke and subacute bilateral MCA/PCA watershed infarctions. Recommendations as above.  Electronically signed: Dr. Caryl Pina

## 2019-04-28 NOTE — ED Notes (Signed)
Help clean patient up change the sheets and put a external cath patient is resting with call bell in reach

## 2019-04-28 NOTE — ED Notes (Signed)
Patient transported to MRI 

## 2019-04-28 NOTE — ED Notes (Signed)
Admitting provider at bedside.
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
## 2019-04-28 NOTE — ED Notes (Signed)
Patient transported to CT 
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
## 2019-04-28 NOTE — ED Notes (Signed)
This RN spoke with Dr. Pilar Plate regarding ambulating patient. Pt alert but not responsive verbally. Attempted to ambulate patient with EMT Waldon Merl, patient unable to follow any commands. Dr. Pilar Plate at bedside to assess patient, agreed, pt unsafe to ambulated at this time.

## 2019-04-28 NOTE — ED Notes (Signed)
Patient is resting comfortably. Pt changed into clean brief and pure wick applied to PT.

## 2019-04-29 ENCOUNTER — Inpatient Hospital Stay (HOSPITAL_COMMUNITY): Payer: Medicare PPO

## 2019-04-29 ENCOUNTER — Other Ambulatory Visit (HOSPITAL_COMMUNITY): Payer: Medicare PPO

## 2019-04-29 DIAGNOSIS — I361 Nonrheumatic tricuspid (valve) insufficiency: Secondary | ICD-10-CM

## 2019-04-29 LAB — BASIC METABOLIC PANEL
Anion gap: 10 (ref 5–15)
BUN: 8 mg/dL (ref 8–23)
CO2: 24 mmol/L (ref 22–32)
Calcium: 9 mg/dL (ref 8.9–10.3)
Chloride: 105 mmol/L (ref 98–111)
Creatinine, Ser: 0.79 mg/dL (ref 0.44–1.00)
GFR calc Af Amer: >60 mL/min (ref >60)
GFR calc non Af Amer: >60 mL/min (ref >60)
Glucose, Bld: 85 mg/dL (ref 70–99)
Potassium: 3.5 mmol/L (ref 3.5–5.1)
Sodium: 139 mmol/L (ref 135–145)

## 2019-04-29 LAB — ECHOCARDIOGRAM COMPLETE
Height: 63 in
Weight: 1440 oz

## 2019-04-29 LAB — CBC
HCT: 31.9 % — ABNORMAL LOW (ref 36.0–46.0)
Hemoglobin: 11 g/dL — ABNORMAL LOW (ref 12.0–15.0)
MCH: 28.4 pg (ref 26.0–34.0)
MCHC: 34.5 g/dL (ref 30.0–36.0)
MCV: 82.2 fL (ref 80.0–100.0)
Platelets: 91 10*3/uL — ABNORMAL LOW (ref 150–400)
RBC: 3.88 MIL/uL (ref 3.87–5.11)
RDW: 14 % (ref 11.5–15.5)
WBC: 5.6 10*3/uL (ref 4.0–10.5)
nRBC: 0 % (ref 0.0–0.2)

## 2019-04-29 LAB — URINE CULTURE

## 2019-04-29 MED ORDER — AMLODIPINE BESYLATE 10 MG PO TABS
10.0000 mg | ORAL_TABLET | Freq: Every day | ORAL | Status: DC
  Filled 2019-04-29: qty 1

## 2019-04-29 MED ORDER — CLONIDINE HCL 0.2 MG/24HR TD PTWK
0.2000 mg | MEDICATED_PATCH | TRANSDERMAL | Status: DC
  Administered 2019-04-29: 0.2 mg via TRANSDERMAL
  Filled 2019-04-29 (×2): qty 1

## 2019-04-29 MED ORDER — DEXTROSE-NACL 5-0.45 % IV SOLN: INTRAVENOUS | Status: AC

## 2019-04-29 NOTE — Progress Notes (Signed)
STROKE TEAM PROGRESS NOTE   HISTORY OF PRESENT ILLNESS (per record) [name] is an 86 y.o. female with a PMH of HTN, HLD, DM, vit b12 deficiency and dementia who presented to the [facility] ED for AMS.  Per chart review: Son  found patient at 2000, 04/27/19 on the floor. Assisted her back to bed, but then the son noted that she was not "acting like herself". She is confused at baseline, but he noticed that she was talking less.  She fell about 1 week ago and afterwards things started to decline. About 1 day ago she had decreased oral intake and a decrease in her speaking. Yesterday, she stopped talking altogether ED course:  CTH: negative for hemorrhage CTA: Left MCA distal M1 occlusion. MRI: Positive for multiple acute infarcts, most confluent in the anterior left MCA territory. Also noted were bilateral MCA/PCA watershed type acute infarctions, greater on the left. Date last known well:  1 week ago Time last known well: unknown tPA Given: no, outside of window      Modified Rankin: Rankin Score=4 NIHSS:20   INTERVAL HISTORY I have personally reviewed history of presenting illness, electronic medical records and imaging films and PACS.  She has history of baseline dementia and presented with aphasia and right hemiplegia due to large left MCA infarct.  EKG shows atrial fibrillation which is probably new.    OBJECTIVE Vitals:   04/28/19 2246 04/28/19 2327 04/29/19 0357 04/29/19 0742  BP: (!) 174/78 126/87 (!) 159/103 (!) 175/88  Pulse: (!) 52 (!) 54 (!) 59 (!) 52  Resp: 16 17 17 16   Temp: 98 F (36.7 C) 98.1 F (36.7 C) 98 F (36.7 C) 97.9 F (36.6 C)  TempSrc: Axillary Oral Oral Oral  SpO2: 100% 99% 100% 100%  Weight:      Height:        CBC:  Recent Labs  Lab 04/28/19 0212 04/29/19 0449  WBC 4.2 5.6  HGB 10.4* 11.0*  HCT 30.9* 31.9*  MCV 84.9 82.2  PLT 94* 91*    Basic Metabolic Panel:  Recent Labs  Lab 04/28/19 0212 04/28/19 1140 04/29/19 0449  NA 144  --   139  K 3.0*  --  3.5  CL 115*  --  105  CO2 18*  --  24  GLUCOSE 110*  --  85  BUN 13  --  8  CREATININE 0.76  --  0.79  CALCIUM 7.7*  --  9.0  MG  --  1.5*  --     Lipid Panel:     Component Value Date/Time   CHOL 269 (H) 04/28/2019 1140   TRIG 48 04/28/2019 1140   HDL 57 04/28/2019 1140   CHOLHDL 4.7 04/28/2019 1140   VLDL 10 04/28/2019 1140   LDLCALC 202 (H) 04/28/2019 1140   HgbA1c:  Lab Results  Component Value Date   HGBA1C 4.5 (L) 04/28/2019   Urine Drug Screen: No results found for: LABOPIA, COCAINSCRNUR, LABBENZ, AMPHETMU, THCU, LABBARB  Alcohol Level No results found for: ETH  IMAGING  CT ANGIO HEAD W OR WO CONTRAST CT ANGIO NECK W OR WO CONTRAST 04/28/2019 IMPRESSION:  1. Positive for Emergent Large Vessel Occlusion: Left MCA distal M1 segment. Some reconstituted Left MCA branches, but they are diminutive.  2. Positive also for superimposed intracranial atherosclerosis, including tandem severe Left and moderate Right PCA stenoses. There are also severe stenoses of the Left Vertebral Artery V4 (sole supply to the Basilar) and moderate Basilar Artery  stenoses.  3. Additionally, the right vertebral artery either functionally terminates outside the skull in muscular branches or is occluded distally.  4. Carotid artery ectasia without plaque or stenosis. Ectatic aortic arch without significant plaque.  5. Small volume retained secretions in the trachea.   CT Head Wo Contrast 04/28/2019 IMPRESSION:  No acute or traumatic finding. Brain atrophy, progressive since 2018. Temporal lobe predominance, as can be seen with Alzheimer's dementia.  MR BRAIN WO CONTRAST 04/28/2019 IMPRESSION:  1. Positive for multiple acute infarcts, most confluent in the anterior Left MCA territory. But there is bilateral MCA/PCA watershed type ischemia greater on the left. Given sparing of the posterior fossa and deep gray nuclei this might reflect sequelae of hypertension superimposed on  intracranial atherosclerosis rather than an embolic event.  2. No associated acute hemorrhage or mass effect.  3. Underlying advanced bilateral temporal lobe atrophy.   CT HEAD WO CONTRAST 04/29/2019 IMPRESSION:  Developing low-density in the areas of acute infarction more extensively in the left MCA territory than the right. No evidence of hemorrhage or significant mass effect/shift.   DG Chest 1 View 04/28/2019 IMPRESSION:  No active disease.  DG Pelvis 1-2 Views 04/28/2019 IMPRESSION:  No definite acute osseous abnormality.   Transthoracic Echocardiogram  00/00/2021 Pending  ECG - atrial fibrillation VR 77 BPM. (See cardiology reading for complete details) Telemetry strips also show episodes of atrial fibrillation   PHYSICAL EXAM Blood pressure (!) 175/88, pulse (!) 52, temperature 97.9 F (36.6 C), temperature source Oral, resp. rate 16, height 5\' 3"  (1.6 m), weight 40.8 kg, SpO2 100 %. Frail malnourished looking elderly African-American lady not in distress. . Afebrile. Head is nontraumatic. Neck is supple without bruit.    Cardiac exam no murmur or gallop. Lungs are clear to auscultation. Distal pulses are well felt. Neurological Exam :  Patient is awake alert but globally aphasic and does not speak or make any sound or follow any commands.  She has left gaze preference and will not look to the right.  She blinks to threat on the left but not on the right.  Fundi not visualized.  Right lower facial weakness.  Tongue midline.  Motor system exam shows flaccid right hemiplegia with 0/5 right upper extremity strength and she does withdraw right lower extremity against gravity to pain.  Purposeful antigravity movements on the left side.  Reflexes are symmetric plantars are both equivocal.    ASSESSMENT/PLAN [name] is a 86 y.o. female with history of HTN, HLD, DM, vit b12 deficiency and dementia who presented to the [facility] ED for AMS after being found on the floor.  She did not  receive IV t-PA due to late presentation (>4.5 hours from time of onset).  Stroke:  Positive for multiple acute infarcts, most confluent in the anterior Left MCA territory - possibly embolic from possible newly diagnosed atrial fibrillation.   Resultant global aphasia, left gaze deviation and right hemiplegia  Code Stroke CT Head - not ordered  CT head - 04/28/19 - No acute or traumatic finding. Brain atrophy, progressive since 2018. Temporal lobe predominance, as can be seen with Alzheimer's dementia.  CT Head - 04/29/19 - Developing low-density in the areas of acute infarction more extensively in the left MCA territory than the right. No evidence of hemorrhage or significant mass effect/shift.   MRI head - Positive for multiple acute infarcts, most confluent in the anterior Left MCA territory. But there is bilateral MCA/PCA watershed type ischemia greater on the  left. Given sparing of the posterior fossa and deep gray nuclei this might reflect sequelae of hypertension superimposed on intracranial atherosclerosis rather than an embolic event.   MRA head - not ordered  CTA H&N -  Positive for Emergent Large Vessel Occlusion: Left MCA distal M1 segment. Some reconstituted Left MCA branches, but they are diminutive. Positive also for superimposed intracranial atherosclerosis, including tandem severe Left and moderate Right PCA stenoses. There are also severe stenoses of the Left Vertebral Artery V4 (sole supply to the Basilar) and moderate Basilar Artery stenoses. Additionally, the right vertebral artery either functionally terminates outside the skull in muscular branches or is occluded distally.   CT Perfusion - not ordered  Carotid Doppler - CTA neck performed - carotid dopplers not indicated.  2D Echo - pending  Loyal Jacobson Virus 2 - negative  LDL - 202  HgbA1c - 4.5  UDS - not ordered  VTE prophylaxis - Natural Steps Heparin Diet  Diet Order            Diet NPO time specified  Diet effective  now              No antithrombotic prior to admission, now on aspirin 300 mg suppository daily  Patient's family will be counseled to be compliant with her antithrombotic medications  Ongoing aggressive stroke risk factor management  Therapy recommendations:  pending  Disposition:  Pending  Hypertension  Home BP meds: Hydralazine, HCTZ  Current BP meds: Norvasc and Transdermal Catapres  Stable . Permissive hypertension (OK if < 220/120) but gradually normalize in 5-7 days  . Long-term BP goal normotensive  Hyperlipidemia  Home Lipid lowering medication: none   LDL 202, goal < 70  Current lipid lowering medication: none / NPO   Continue statin at discharge  Diabetes ? (listed as one of her medical problems)  Home diabetic meds: none   Current diabetic meds: none  HgbA1c 4.5, goal < 7.0 No results for input(s): GLUCAP in the last 72 hours.  Other Stroke Risk Factors  Advanced age  Family hx stroke (father)  Possible newly diagnosed atrial fibrillation  Other Active Problems  Code status - DNR  Newly diagnosed atrial fibrillation  Palliative care consult - pending  Anemia   Thrombocytopenia  Hypomagnesemia    Hospital day # 1  I have personally obtained history,examined this patient, reviewed notes, independently viewed imaging studies, participated in medical decision making and plan of care.ROS completed by me personally and pertinent positives fully documented  I have made any additions or clarifications directly to the above note. Agree with note above.  She presented with global aphasia and right hemiplegia secondary to multiple by cerebral embolic infarcts from atrial fibrillation.  Given her baseline dementia and poor general medical condition and extensive strokes her prognosis is quite poor and I agree with palliative care approachI.I called and left a message for her son [name] to call me back..  Discussed with family goals of care.   Discussed with Dr. Sherlynn Stalls I have spent a total of  35  minutes with the patient reviewing hospital notes,  test results, labs and examining the patient as well as establishing an assessment and plan that was discussed personally with the patient.  > 50% of time was spent in direct patient care.      Delia Heady, MD Medical Director [facility] Pager: [phone] 04/29/2019 1:12 PM   To contact Stroke Continuity provider, please refer to [email or web address]. After hours, contact General  Neurology

## 2019-04-29 NOTE — Evaluation (Signed)
Speech Language Pathology Evaluation Patient Details Name: [name] MRN: [id number] DOB: 1930/03/01 Today's Date: 04/29/2019 Time: 4332-9518 SLP Time Calculation (min) (ACUTE ONLY): 10 min  Problem List:  Patient Active Problem List   Diagnosis Date Noted  . Acute embolic stroke ([place]) 84/16/6063  . Abnormal urinalysis 04/28/2019  . DNR (do not resuscitate) 04/28/2019  . Bradycardia 03/13/2017  . Abdominal pain 03/13/2017  . Dementia ([place]) 03/13/2017  . Uncontrolled hypertension 03/13/2017  . ATN (acute tubular necrosis) ([place]) 07/03/2015  . Hyperkalemia 07/03/2015  . AKI (acute kidney injury) ([place]) 07/03/2015  . Acute encephalopathy 07/03/2015   Past Medical History:  Past Medical History:  Diagnosis Date  . DDD (degenerative disc disease), lumbar   . Dementia ([place])   . Full dentures   . Hyperlipidemia   . Hypertension   . Memory loss   . Osteopenia   . Renal calculus, left   . Retained ureteral stent    RETAINED SINCE 2001  . Sigmoid diverticulosis   . Type 2 diabetes mellitus ([place])   . Vitamin B12 deficiency    Past Surgical History:  Past Surgical History:  Procedure Laterality Date  . APPENDECTOMY  1962  . CATARACT EXTRACTION W/ INTRAOCULAR LENS  IMPLANT, BILATERAL  2000  . CHOLECYSTECTOMY  2004  . CYSTO/  LEFT RETROGRADE PYELOGRAM/  URETERAL STENT PLACEMENT  06-10-1999  . CYSTOSCOPY W/ URETERAL STENT REMOVAL Left 07/30/2014   Procedure: CYSTOSCOPY WITH FRAGMENTED STENT REMOVAL;  Surgeon: Lowella Bandy, MD;  Location: [facility];  Service: Urology;  Laterality: Left;  . OVARIAN CYST SURGERY    . TONSILLECTOMY  1970  . TOTAL ABDOMINAL HYSTERECTOMY WITH RIGHT SALPINGOOPHORECTOMY/  [place] PROCEDURE/  COLPOCYSTOPEXY AND BURCH PROCEDURE  11-02-1999  . TUBAL LIGATION  YRS AGO  . URETEROSCOPY Left 07/30/2014   Procedure: URETEROSCOPY;  Surgeon: Lowella Bandy, MD;  Location: [facility];  Service: Urology;  Laterality: Left;   HPI:  [name] is a 86 y.o. female with medical history significant of hypertension, hyperlipidemia, diabetes mellitus type 2, nephrolithiasis with retained ureteral stent, and dementia.  She presents after being found to be acutely altered.  History obtained from one of the patient's sons over the phone.  She has dementia at baseline, but was able to hold a conversation. One of her sons helps care for her.  Yesterday morning she had an unwitnessed fall and was found on the floor. Since that time she had not been talkative or responding like normal.  A few weeks ago patient had a dizzy spell and almost fell.  To their knowledge she had no history of an irregular heart rhythm, but were previously told that she had slow heart rates that times.  She is not on any form of anticoagulation.  MRI of the brain is showing the following:  multiple acute infarcts, most confluent in the anterior Left MCA territory. But there is bilateral MCA/PCA watershed type ischemia greater on the left.  Given sparing of the posterior fossa and deep gray nuclei this might reflect sequelae of hypertension superimposed on intracranial atherosclerosis rather than an embolic event.  2. No associated acute hemorrhage or mass effect.  3. Underlying advanced bilateral temporal lobe atrophy.  CXR was showing no active disease.     Assessment / Plan / Recommendation Clinical Impression  Language evaluation was completed using informal assessment.  The patient currently presents with severe receptive and expressive language deficits.  She is non verbal.  Per notes  at baseline she is able to carry on a conversation.  She is unable to name objects even given max cues.  She is unable to identify objects in a field of 2, follow simle 1 step directions and complete rote speech tasks (eg: counting 1-5) even given max cues.  She will require ST follow up to maximize functional status and attempt to identify a mode of communication.      SLP Assessment  SLP  Recommendation/Assessment: Patient needs continued Speech Lanaguage Pathology Services SLP Visit Diagnosis: Aphasia (R47.01)    Follow Up Recommendations  Skilled Nursing facility    Frequency and Duration min 2x/week  2 weeks      SLP Evaluation Cognition    Not assessed due to severity of language deficits      Comprehension  Auditory Comprehension Overall Auditory Comprehension: Impaired Yes/No Questions: Not tested Commands: Impaired One Step Basic Commands: 0-24% accurate    Expression Expression Primary Mode of Expression: Other (comment) Verbal Expression Overall Verbal Expression: Impaired Initiation: Impaired Repetition: Impaired Level of Impairment: Word level Naming: Impairment Responsive: 0-25% accurate   Oral / Motor  Oral Motor/Sensory Function Overall Oral Motor/Sensory Function: Other (comment)(Could not assess, pt unable to follow commands) Motor Speech Overall Motor Speech: Other (comment)(unable to assess)   GO                   Dimas Aguas, MA, CCC-SLP Acute Rehab SLP [phone]  Fleet Contras 04/29/2019, 10:10 AM

## 2019-04-29 NOTE — Progress Notes (Addendum)
PROGRESS NOTE    [name]  HEN:[id number] DOB: 03-Mar-1930 DOA: 04/28/2019 PCP: Pearson Grippe, MD   Brief Narrative:  [name] is a 86 y.o. female with medical history significant of hypertension, hyperlipidemia, diabetes mellitus type 2, nephrolithiasis with retained ureteral stent, and dementia with baseline confusion but can talk.  She presents after being found to be acutely altered after an unwitnessed fall at home, per her sons. She has stopped talking since the day before admission.   ED course:  CTH: negative for hemorrhage CTA: Left MCA distal M1 occlusion. MRI: Positive for multiple acute infarcts, most confluent in the anterior left MCA territory. Also noted were bilateral MCA/PCA watershed type acute infarctions, greater on the left.  Given the onset of stroke symptoms is beyond the tPA window, no tPA was given. Rankin Score =4; NIHSS: 20 on admission. Neurology and stroke team consulted. Pt has uncontrolled BP, on iv PRN hydralazine and clonidine patch as she has not passed swallow test. LDL 202. A1c 4.5. on ASA supp, no Lipitor yet given NPO status. Repeated CTH showed no evidence of hemorrhage or significant mass effect. Pending ECHO with bubble study.  Pending palliative care.   Assessment & Plan:   Principal Problem:   Acute embolic stroke [facility]) Active Problems:   Acute encephalopathy   Dementia (HCC)   Uncontrolled hypertension   Abnormal urinalysis   DNR (do not resuscitate)  Plan: Acute stroke, embolic? Based on MRI findings - appreciate neurology/stroke team input - still pending SLP evaluation to see if she can be advanced of diet and oral medication - not a tPA candidate as her presentation is out of treatment window - repeated CTH today did not show hemorrhagic conversion or mass effect - pending ECHO with bubble study given multiple acute infarcts - pt received ASA 600 mg X1 dose rectally, and will continue 300 mg rectally daily until she can take  p.o.; not able to do Lipitor yet - change iv NS to iv D5 1/2NS for hydration and avoid hypoglycemia as she's NPO - she has uncontrolled HTN, will continue iv PRN hydralazine, will start clonidine patch (need to watch her HR as it is already mildly bradycardic), she's not able to take po antihypertensive meds yet - LDL 202; A1C 4.5 (no diabetes meds at home) - PT/OT/SLP pending - poor prognosis based on MRI brain finding and current mental status and functional status. She's DNR but will need palliative care consult on Monday to discuss about goal of care.   Acute encephalopathy is due to acute stroke; based on UA finding, unlikely due to UTI  Electrolyte disturbance: both K and Ca were repleted since admission. Continue to monitor BMP.   DVT prophylaxis: heparin [place] Code Status: DNR Family Communication: no family at bedside Disposition Plan:  Pending clinical course, SNF vs hospice   Consultants:   Neurology/ stroke team, palliative care  Procedures: purewick  Antimicrobials: none  Subjective: Vital signs stable but BP has been fluctuating. She's still non-verbal. Awake and eyes widely open but no conjugation toward me. Not following command now but per RN, she had intermittent lucent time last night and was able to raise leg or arm per instruction but sometimes needs sternal rub to be awake. BP 175/88, HR 52 this morning.   Objective: Vitals:   04/28/19 2246 04/28/19 2327 04/29/19 0357 04/29/19 0742  BP: (!) 174/78 126/87 (!) 159/103 (!) 175/88  Pulse: (!) 52 (!) 54 (!) 59 (!) 52  Resp: 16  17 17 16   Temp: 98 F (36.7 C) 98.1 F (36.7 C) 98 F (36.7 C) 97.9 F (36.6 C)  TempSrc: Axillary Oral Oral Oral  SpO2: 100% 99% 100% 100%  Weight:      Height:        Intake/Output Summary (Last 24 hours) at 04/29/2019 0845 Last data filed at 04/29/2019 0357 Gross per 24 hour  Intake 742.5 ml  Output 300 ml  Net 442.5 ml   Filed Weights   04/28/19 0210  Weight: 40.8 kg     Examination:  General exam: Appears calm and comfortable. Elderly, not wearing dentures, non-verbal and not following commands. Eyes open Respiratory system: Clear to auscultation. Respiratory effort normal. Cardiovascular system: S1 & S2 heard, RRR. No JVD, murmurs, rubs, gallops or clicks. No pedal edema. Gastrointestinal system: Abdomen is nondistended, soft and nontender. No organomegaly or masses felt. Normal bowel sounds heard. Central nervous system: Awake, not oriented, opens eyes to voice, unable to follow commands, no verbalizations noted. Right hemianopsia, did not blink to threat from right  PERRL 56mm bilaterally, shuts eyes tightly in response to stimulation, head midline, face appears symmetric at rest. Bulk decreased in all 4 extremities.  Right arm flaccid. No movement to noxious RLE decreased bulk 2/5 strength with withdrawal to noxious. LUE cog wheeling and 3 Htz coarse tremor worse with movement. 3/5 withdraws to noxious. LLE normal tone  With minor tremor. Withdraws 3/5 strength to noxious. Does not move RUE to noxious. Not able to assess gait or cerebellar function as she's not following commands.  Extremities: Symmetric 5 x 5 power. Skin: No rashes, lesions or ulcers Psychiatry: Judgement and insight appear normal. Mood & affect appropriate.     Data Reviewed: I have personally reviewed following labs and imaging studies  CBC: Recent Labs  Lab 04/28/19 0212 04/29/19 0449  WBC 4.2 5.6  HGB 10.4* 11.0*  HCT 30.9* 31.9*  MCV 84.9 82.2  PLT 94* 91*   Basic Metabolic Panel: Recent Labs  Lab 04/28/19 0212 04/28/19 1140 04/29/19 0449  NA 144  --  139  K 3.0*  --  3.5  CL 115*  --  105  CO2 18*  --  24  GLUCOSE 110*  --  85  BUN 13  --  8  CREATININE 0.76  --  0.79  CALCIUM 7.7*  --  9.0  MG  --  1.5*  --    GFR: Estimated Creatinine Clearance: 31.9 mL/min (by C-G formula based on SCr of 0.79 mg/dL). Liver Function Tests: Recent Labs  Lab  04/28/19 0212  AST 15  ALT 9  ALKPHOS 35*  BILITOT 0.7  PROT 5.6*  ALBUMIN 3.1*   No results for input(s): LIPASE, AMYLASE in the last 168 hours. No results for input(s): AMMONIA in the last 168 hours. Coagulation Profile: No results for input(s): INR, PROTIME in the last 168 hours. Cardiac Enzymes: No results for input(s): CKTOTAL, CKMB, CKMBINDEX, TROPONINI in the last 168 hours. BNP (last 3 results) No results for input(s): PROBNP in the last 8760 hours. HbA1C: Recent Labs    04/28/19 1140  HGBA1C 4.5*   CBG: No results for input(s): GLUCAP in the last 168 hours. Lipid Profile: Recent Labs    04/28/19 1140  CHOL 269*  HDL 57  LDLCALC 202*  TRIG 48  CHOLHDL 4.7   Thyroid Function Tests: Recent Labs    04/28/19 1140  TSH 1.363   Anemia Panel: No results for input(s): VITAMINB12, FOLATE, FERRITIN,  TIBC, IRON, RETICCTPCT in the last 72 hours. Sepsis Labs: No results for input(s): PROCALCITON, LATICACIDVEN in the last 168 hours.  Recent Results (from the past 240 hour(s))  SARS CORONAVIRUS 2 (TAT 6-24 HRS) Nasopharyngeal Nasopharyngeal Swab     Status: None   Collection Time: 04/28/19  6:32 AM   Specimen: Nasopharyngeal Swab  Result Value Ref Range Status   SARS Coronavirus 2 NEGATIVE NEGATIVE Final    Comment: (NOTE) SARS-CoV-2 target nucleic acids are NOT DETECTED. The SARS-CoV-2 RNA is generally detectable in upper and lower respiratory specimens during the acute phase of infection. Negative results do not preclude SARS-CoV-2 infection, do not rule out co-infections with other pathogens, and should not be used as the sole basis for treatment or other patient management decisions. Negative results must be combined with clinical observations, patient history, and epidemiological information. The expected result is Negative. Fact Sheet for Patients: HairSlick.nohttps://www.fda.gov/media/138098/download Fact Sheet for Healthcare  Providers: quierodirigir.comhttps://www.fda.gov/media/138095/download This test is not yet approved or cleared by the [place] FDA and  has been authorized for detection and/or diagnosis of SARS-CoV-2 by FDA under an Emergency Use Authorization (EUA). This EUA will remain  in effect (meaning this test can be used) for the duration of the COVID-19 declaration under Section 56 4(b)(1) of the Act, 21 U.S.C. section 360bbb-3(b)(1), unless the authorization is terminated or revoked sooner. Performed at [facility], [address] [place], [place] [address]          Radiology Studies: CT ANGIO HEAD W OR WO CONTRAST  Addendum Date: 04/28/2019   ADDENDUM REPORT: 04/28/2019 12:14 ADDENDUM: Study discussed by telephone with Dr. Madelyn FlavorsONDELL SMITH with the hospitalist service on 04/28/2019 at 1211 hours. Electronically Signed   By: Odessa FlemingH  Hall M.D.   On: 04/28/2019 12:14   Result Date: 04/28/2019 CLINICAL DATA:  86 year old found down with mental status changes. Acute left MCA and also bilateral MCA/PCA watershed area infarcts on brain MRI today. EXAM: CT ANGIOGRAPHY HEAD AND NECK TECHNIQUE: Multidetector CT imaging of the head and neck was performed using the standard protocol during bolus administration of intravenous contrast. Multiplanar CT image reconstructions and MIPs were obtained to evaluate the vascular anatomy. Carotid stenosis measurements (when applicable) are obtained utilizing NASCET criteria, using the distal internal carotid diameter as the denominator. CONTRAST:  50mL OMNIPAQUE IOHEXOL 350 MG/ML SOLN COMPARISON:  Brain MRI and head CT. Earlier today. FINDINGS: CTA NECK Skeleton: Absent dentition. No acute osseous abnormality identified. Upper chest: Small volume retained secretions in the trachea on series 5, image 146. Visible upper lungs are clear. Negative visible superior mediastinum. Other neck: Negative. No acute findings. Aortic arch: Good contrast timing. Four vessel arch configuration, the left  vertebral artery arises directly from the arch. Ectatic arch with minimal atherosclerosis. Right carotid system: Negative brachiocephalic artery and right CCA origin aside from mild tortuosity. Tortuous proximal right CCA with a kinked appearance at the thoracic inlet and retropharyngeal course. Capacious right carotid bifurcation. Partially retropharyngeal cervical right ICA without plaque or stenosis. Left carotid system: Negative left CCA origin. Mildly tortuous left CCA. Capacious and partially retropharyngeal left carotid bifurcation. Retropharyngeal cervical left ICA is tortuous without plaque or stenosis. Vertebral arteries: Ectatic proximal right subclavian artery with minimal plaque and no stenosis. Capacious right vertebral artery origin. The right V1 segment tapers, and the vessel has a very late entry into the cervical transverse foramen, diminutive up until the the more distal V2 segment. However, the vessel is occluded in the V3 segment  and/or terminates in upper cervical muscular branches. The left vertebral artery arises directly from the arch with no plaque or stenosis. The left vertebral has a fairly normal caliber and is patent to the skull base without plaque or stenosis. CTA HEAD Posterior circulation: The left vertebral artery supplies the basilar with severe stenosis in the distal V4 segment on series 7, image 20 and series 11, image 23. The right AICA is patent and probably dominant. There is no distal right vertebral artery enhancement. Patent basilar artery with moderate mid basilar irregularity and stenosis. The distal basilar is also tapered with moderate stenosis (series 11, image 22). But the tip remains patent with fetal type bilateral PCA origins. The right posterior communicating artery and proximal right PCA are within normal limits. There is a mild to moderate right P2 segment stenosis on series 12, image 14. The left posterior communicating artery and left PCA are highly  atherosclerotic and demonstrates severe tandem stenoses through the P2 and proximal P3 segments (series 12, image 18). There is distal PCA enhancement. Anterior circulation: Both ICA siphons are patent and ectatic without plaque or stenosis. Normal ophthalmic and posterior communicating artery origins. Patent carotid termini. Normal MCA and left ACA origins. Left A1 is dominant and the right diminutive. Anterior communicating artery and bilateral ACA branches are within normal limits. Right MCA M1 segment, bifurcation, and proximal right MCA branches are within normal limits. There is mild distal right MCA branch irregularity and stenosis. The left MCA M1 segment is occluded about 18 mm distal to its origin near the left MCA bifurcation. There are some reconstituted left MCA branches but they are diminutive (series 12, image 23). Venous sinuses: Early contrast timing but grossly patent. Anatomic variants: Left vertebral artery arises directly from the arch and supplies the basilar due to either distal right vertebral artery occlusion or terminating outside the skull in muscular branches. Fetal type bilateral PCA origins. Dominant left and diminutive right ACA A1 segments. Review of the MIP images confirms the above findings IMPRESSION: 1. Positive for Emergent Large Vessel Occlusion: Left MCA distal M1 segment. Some reconstituted Left MCA branches, but they are diminutive. 2. Positive also for superimposed intracranial atherosclerosis, including tandem severe Left and moderate Right PCA stenoses. There are also severe stenoses of the Left Vertebral Artery V4 (sole supply to the Basilar) and moderate Basilar Artery stenoses. 3. Additionally, the right vertebral artery either functionally terminates outside the skull in muscular branches or is occluded distally. 4. Carotid artery ectasia without plaque or stenosis. Ectatic aortic arch without significant plaque. 5. Small volume retained secretions in the trachea.  Electronically Signed: By: Odessa Fleming M.D. On: 04/28/2019 12:05   DG Chest 1 View  Result Date: 04/28/2019 CLINICAL DATA:  Fall EXAM: CHEST  1 VIEW COMPARISON:  None. FINDINGS: There is mild cardiomegaly. Aortic knob calcifications. There is slight hyperinflation of the upper lung zones. No focal airspace consolidation or pleural effusion. No acute osseous abnormality. IMPRESSION: No active disease. Electronically Signed   By: Jonna Clark M.D.   On: 04/28/2019 03:34   DG Pelvis 1-2 Views  Result Date: 04/28/2019 CLINICAL DATA:  Fall EXAM: PELVIS - 1-2 VIEW COMPARISON:  None. FINDINGS: There is no evidence of pelvic fracture or diastasis. There is diffuse osteopenia. Overlying bowel gas and stool seen within the deep pelvis. Degenerative changes in the lower lumbar spine. IMPRESSION: No definite acute osseous abnormality. Electronically Signed   By: Jonna Clark M.D.   On: 04/28/2019 03:33  CT HEAD WO CONTRAST  Result Date: 04/29/2019 CLINICAL DATA:  Stroke follow-up. Left brain infarctions diagnosed yesterday. EXAM: CT HEAD WITHOUT CONTRAST TECHNIQUE: Contiguous axial images were obtained from the base of the skull through the vertex without intravenous contrast. COMPARISON:  CT and MR studies 04/28/2019 FINDINGS: Brain: Developing low-density in the region of infarction primarily in the left hemisphere and most prominently in the left frontal operculum ir region and frontoparietal cortical and subcortical brain. Early mild swelling but no mass effect. No hemorrhagic transformation. Slightly more prominent low-density in the deep white matter of the right parietal lobe in the area of smaller acute infarctions. No new area of brain involvement is seen. No hydrocephalus. No shift. Vascular: No vascular finding by CT. Skull: Negative Sinuses/Orbits: Clear/normal.  Previous lens implants. Other: None IMPRESSION: Developing low-density in the areas of acute infarction more extensively in the left MCA territory  than the right. No evidence of hemorrhage or significant mass effect/shift. Electronically Signed   By: Paulina FusiMark  Shogry M.D.   On: 04/29/2019 00:20   CT Head Wo Contrast  Result Date: 04/28/2019 CLINICAL DATA:  Found on the floor tonight.  Mental status changes. EXAM: CT HEAD WITHOUT CONTRAST TECHNIQUE: Contiguous axial images were obtained from the base of the skull through the vertex without intravenous contrast. COMPARISON:  03/13/2017 FINDINGS: Brain: Pronounced generalized brain atrophy, progressive since 2018. There is temporal lobe predominance. Chronic small-vessel ischemic changes affect the hemispheric white matter. No sign of acute infarction, mass lesion, hemorrhage, hydrocephalus or extra-axial collection. Vascular: There is atherosclerotic calcification of the major vessels at the base of the brain. Skull: No skull fracture. Sinuses/Orbits: Clear/normal Other: None IMPRESSION: No acute or traumatic finding. Brain atrophy, progressive since 2018. Temporal lobe predominance, as can be seen with Alzheimer's dementia. Electronically Signed   By: Paulina FusiMark  Shogry M.D.   On: 04/28/2019 03:43   CT ANGIO NECK W OR WO CONTRAST  Addendum Date: 04/28/2019   ADDENDUM REPORT: 04/28/2019 12:14 ADDENDUM: Study discussed by telephone with Dr. Madelyn FlavorsONDELL SMITH with the hospitalist service on 04/28/2019 at 1211 hours. Electronically Signed   By: Odessa FlemingH  Hall M.D.   On: 04/28/2019 12:14   Result Date: 04/28/2019 CLINICAL DATA:  86 year old found down with mental status changes. Acute left MCA and also bilateral MCA/PCA watershed area infarcts on brain MRI today. EXAM: CT ANGIOGRAPHY HEAD AND NECK TECHNIQUE: Multidetector CT imaging of the head and neck was performed using the standard protocol during bolus administration of intravenous contrast. Multiplanar CT image reconstructions and MIPs were obtained to evaluate the vascular anatomy. Carotid stenosis measurements (when applicable) are obtained utilizing NASCET criteria, using  the distal internal carotid diameter as the denominator. CONTRAST:  50mL OMNIPAQUE IOHEXOL 350 MG/ML SOLN COMPARISON:  Brain MRI and head CT. Earlier today. FINDINGS: CTA NECK Skeleton: Absent dentition. No acute osseous abnormality identified. Upper chest: Small volume retained secretions in the trachea on series 5, image 146. Visible upper lungs are clear. Negative visible superior mediastinum. Other neck: Negative. No acute findings. Aortic arch: Good contrast timing. Four vessel arch configuration, the left vertebral artery arises directly from the arch. Ectatic arch with minimal atherosclerosis. Right carotid system: Negative brachiocephalic artery and right CCA origin aside from mild tortuosity. Tortuous proximal right CCA with a kinked appearance at the thoracic inlet and retropharyngeal course. Capacious right carotid bifurcation. Partially retropharyngeal cervical right ICA without plaque or stenosis. Left carotid system: Negative left CCA origin. Mildly tortuous left CCA. Capacious and partially retropharyngeal left carotid bifurcation.  Retropharyngeal cervical left ICA is tortuous without plaque or stenosis. Vertebral arteries: Ectatic proximal right subclavian artery with minimal plaque and no stenosis. Capacious right vertebral artery origin. The right V1 segment tapers, and the vessel has a very late entry into the cervical transverse foramen, diminutive up until the the more distal V2 segment. However, the vessel is occluded in the V3 segment and/or terminates in upper cervical muscular branches. The left vertebral artery arises directly from the arch with no plaque or stenosis. The left vertebral has a fairly normal caliber and is patent to the skull base without plaque or stenosis. CTA HEAD Posterior circulation: The left vertebral artery supplies the basilar with severe stenosis in the distal V4 segment on series 7, image 20 and series 11, image 23. The right AICA is patent and probably dominant.  There is no distal right vertebral artery enhancement. Patent basilar artery with moderate mid basilar irregularity and stenosis. The distal basilar is also tapered with moderate stenosis (series 11, image 22). But the tip remains patent with fetal type bilateral PCA origins. The right posterior communicating artery and proximal right PCA are within normal limits. There is a mild to moderate right P2 segment stenosis on series 12, image 14. The left posterior communicating artery and left PCA are highly atherosclerotic and demonstrates severe tandem stenoses through the P2 and proximal P3 segments (series 12, image 18). There is distal PCA enhancement. Anterior circulation: Both ICA siphons are patent and ectatic without plaque or stenosis. Normal ophthalmic and posterior communicating artery origins. Patent carotid termini. Normal MCA and left ACA origins. Left A1 is dominant and the right diminutive. Anterior communicating artery and bilateral ACA branches are within normal limits. Right MCA M1 segment, bifurcation, and proximal right MCA branches are within normal limits. There is mild distal right MCA branch irregularity and stenosis. The left MCA M1 segment is occluded about 18 mm distal to its origin near the left MCA bifurcation. There are some reconstituted left MCA branches but they are diminutive (series 12, image 23). Venous sinuses: Early contrast timing but grossly patent. Anatomic variants: Left vertebral artery arises directly from the arch and supplies the basilar due to either distal right vertebral artery occlusion or terminating outside the skull in muscular branches. Fetal type bilateral PCA origins. Dominant left and diminutive right ACA A1 segments. Review of the MIP images confirms the above findings IMPRESSION: 1. Positive for Emergent Large Vessel Occlusion: Left MCA distal M1 segment. Some reconstituted Left MCA branches, but they are diminutive. 2. Positive also for superimposed  intracranial atherosclerosis, including tandem severe Left and moderate Right PCA stenoses. There are also severe stenoses of the Left Vertebral Artery V4 (sole supply to the Basilar) and moderate Basilar Artery stenoses. 3. Additionally, the right vertebral artery either functionally terminates outside the skull in muscular branches or is occluded distally. 4. Carotid artery ectasia without plaque or stenosis. Ectatic aortic arch without significant plaque. 5. Small volume retained secretions in the trachea. Electronically Signed: By: Odessa Fleming M.D. On: 04/28/2019 12:05   MR BRAIN WO CONTRAST  Result Date: 04/28/2019 CLINICAL DATA:  86 year old female found down with mental status changes. EXAM: MRI HEAD WITHOUT CONTRAST TECHNIQUE: Multiplanar, multiecho pulse sequences of the brain and surrounding structures were obtained without intravenous contrast. COMPARISON:  Head CT 0331 hours today. FINDINGS: Brain: Patchy and confluent restricted diffusion in the left hemisphere is most pronounced in the anterior MCA territory (series 7, image 53) including confluent involvement of the operculum and  anterior left insula. However, there is also multifocal patchy left PCA and PCA/MCA watershed area ischemia with infarcts (left occipital lobe series 7, image 50). There are also mild contralateral right posterior MCA/PCA watershed area white matter infarcts (series 7, image 51). Sparing of the deep gray nuclei, brainstem and cerebellum. Associated T2 and FLAIR hyperintensity in the acutely affected areas with no mass effect or definite acute hemorrhage. There are occasional chronic micro hemorrhages including in the right periatrial white matter on series 12, image 14. There is patchy additional bilateral white matter T2 and FLAIR hyperintensity. There is chronic atrophy and/or encephalomalacia in both anterior temporal lobes with pronounced mesial temporal volume loss (series 11, image 10 and series 14, image 14). But no  other cortical encephalomalacia. Ex vacuo ventricular enlargement. No midline shift, mass effect, evidence of mass lesion, extra-axial collection or acute intracranial hemorrhage. Cervicomedullary junction and pituitary are within normal limits. Vascular: Major intracranial vascular flow voids are preserved. Skull and upper cervical spine: Negative visible cervical spine. Normal bone marrow signal. Sinuses/Orbits: Postoperative changes to the globes. Otherwise grossly negative orbits and paranasal sinuses. Other: Mastoids are clear. Scalp and face soft tissues appear negative. IMPRESSION: 1. Positive for multiple acute infarcts, most confluent in the anterior Left MCA territory. But there is bilateral MCA/PCA watershed type ischemia greater on the left. Given sparing of the posterior fossa and deep gray nuclei this might reflect sequelae of hypertension superimposed on intracranial atherosclerosis rather than an embolic event. 2. No associated acute hemorrhage or mass effect. 3. Underlying advanced bilateral temporal lobe atrophy. Electronically Signed   By: Odessa Fleming M.D.   On: 04/28/2019 10:18        Scheduled Meds: . amLODipine  10 mg Oral Daily  . aspirin  300 mg Rectal Daily  . cloNIDine  0.2 mg Transdermal Weekly  . heparin injection (subcutaneous)  5,000 Units Subcutaneous Q12H  . sodium chloride flush  3 mL Intravenous Q12H   Continuous Infusions: . sodium chloride 50 mL/hr at 04/29/19 0014     LOS: 1 day    Time spent: total 25 min on this encounter with >50% of time on direct patient care and plan formulation.    Gala Murdoch, MD [facility] Pager [phone]  If 7PM-7AM, please contact night-coverage [email or web address] Password [facility] 04/29/2019, 8:45 AM

## 2019-04-29 NOTE — Evaluation (Signed)
Clinical/Bedside Swallow Evaluation Patient Details  Name: [name] MRN: [id number] Date of Birth: February 29, 1930  Today's Date: 04/29/2019 Time: SLP Start Time (ACUTE ONLY): 0930 SLP Stop Time (ACUTE ONLY): 0945 SLP Time Calculation (min) (ACUTE ONLY): 15 min  Past Medical History:  Past Medical History:  Diagnosis Date  . DDD (degenerative disc disease), lumbar   . Dementia ([place])   . Full dentures   . Hyperlipidemia   . Hypertension   . Memory loss   . Osteopenia   . Renal calculus, left   . Retained ureteral stent    RETAINED SINCE 2001  . Sigmoid diverticulosis   . Type 2 diabetes mellitus ([place])   . Vitamin B12 deficiency    Past Surgical History:  Past Surgical History:  Procedure Laterality Date  . APPENDECTOMY  1962  . CATARACT EXTRACTION W/ INTRAOCULAR LENS  IMPLANT, BILATERAL  2000  . CHOLECYSTECTOMY  2004  . CYSTO/  LEFT RETROGRADE PYELOGRAM/  URETERAL STENT PLACEMENT  06-10-1999  . CYSTOSCOPY W/ URETERAL STENT REMOVAL Left 07/30/2014   Procedure: CYSTOSCOPY WITH FRAGMENTED STENT REMOVAL;  Surgeon: Lowella Bandy, MD;  Location: [facility];  Service: Urology;  Laterality: Left;  . OVARIAN CYST SURGERY    . TONSILLECTOMY  1970  . TOTAL ABDOMINAL HYSTERECTOMY WITH RIGHT SALPINGOOPHORECTOMY/  [place] PROCEDURE/  COLPOCYSTOPEXY AND BURCH PROCEDURE  11-02-1999  . TUBAL LIGATION  YRS AGO  . URETEROSCOPY Left 07/30/2014   Procedure: URETEROSCOPY;  Surgeon: Lowella Bandy, MD;  Location: [facility];  Service: Urology;  Laterality: Left;   HPI:  [name] is a 86 y.o. female with medical history significant of hypertension, hyperlipidemia, diabetes mellitus type 2, nephrolithiasis with retained ureteral stent, and dementia.  She presents after being found to be acutely altered.  History obtained from one of the patient's sons over the phone.  She has dementia at baseline, but was able to hold a conversation. One of her sons helps care for her.   Yesterday morning she had an unwitnessed fall and was found on the floor. Since that time she had not been talkative or responding like normal.  A few weeks ago patient had a dizzy spell and almost fell.  To their knowledge she had no history of an irregular heart rhythm, but were previously told that she had slow heart rates that times.  She is not on any form of anticoagulation.  MRI of the brain is showing the following:  multiple acute infarcts, most confluent in the anterior Left MCA territory. But there is bilateral MCA/PCA watershed type ischemia greater on the left.  Given sparing of the posterior fossa and deep gray nuclei this might reflect sequelae of hypertension superimposed on intracranial atherosclerosis rather than an embolic event.  2. No associated acute hemorrhage or mass effect.  3. Underlying advanced bilateral temporal lobe atrophy.  CXR was showing no active disease.     Assessment / Plan / Recommendation Clinical Impression  Clinical swallowing evaluation was completed using thin liquids via spoon and cup following oral care.  The patient was non verbal and unable to follow any commands.  Therefore, cranial nerve exam was not completed.  She presented with a probable oropharyngeal dysphagia.  Anterior escape was seen given thin liquids and appeared related to labial weakness.  Swallow trigger was appreciated to palpation.  Cough was not seen, however, the patient swallowed 7-8 times to clear presetned boluses.  Given clinical presentation recommend that she remain NPO.  ST  will follow up next date to re-assess the patient's swallow and for possible diet vs need for instrumental exam.   SLP Visit Diagnosis: Dysphagia, unspecified (R13.10)    Aspiration Risk  Severe aspiration risk    Diet Recommendation   NPO   Medication Administration: Via alternative means    Other  Recommendations Oral Care Recommendations: Oral care QID   Follow up Recommendations Other (comment)(TBD)       Frequency and Duration     To be determined.       Prognosis Prognosis for Safe Diet Advancement: Fair Barriers to Reach Goals: Cognitive deficits;Severity of deficits      Swallow Study   General Date of Onset: 04/28/19 HPI: [name] is a 86 y.o. female with medical history significant of hypertension, hyperlipidemia, diabetes mellitus type 2, nephrolithiasis with retained ureteral stent, and dementia.  She presents after being found to be acutely altered.  History obtained from one of the patient's sons over the phone.  She has dementia at baseline, but was able to hold a conversation. One of her sons helps care for her.  Yesterday morning she had an unwitnessed fall and was found on the floor. Since that time she had not been talkative or responding like normal.  A few weeks ago patient had a dizzy spell and almost fell.  To their knowledge she had no history of an irregular heart rhythm, but were previously told that she had slow heart rates that times.  She is not on any form of anticoagulation.  MRI of the brain is showing the following:  multiple acute infarcts, most confluent in the anterior Left MCA territory. But there is bilateral MCA/PCA watershed type ischemia greater on the left.  Given sparing of the posterior fossa and deep gray nuclei this might reflect sequelae of hypertension superimposed on intracranial atherosclerosis rather than an embolic event.  2. No associated acute hemorrhage or mass effect.  3. Underlying advanced bilateral temporal lobe atrophy.  CXR was showing no active disease.   Type of Study: Bedside Swallow Evaluation Previous Swallow Assessment: None noted at [facility]. Diet Prior to this Study: NPO Temperature Spikes Noted: No Respiratory Status: Room air History of Recent Intubation: No Behavior/Cognition: Alert;Doesn't follow directions Oral Cavity Assessment: Dry;Dried secretions Oral Care Completed by SLP: Yes Oral Cavity - Dentition:  Edentulous Vision: Impaired for self-feeding Self-Feeding Abilities: Total assist Patient Positioning: Upright in bed Baseline Vocal Quality: Not observed Volitional Cough: Cognitively unable to elicit Volitional Swallow: Unable to elicit    Oral/Motor/Sensory Function Overall Oral Motor/Sensory Function: Other (comment)(Could not assess, pt unable to follow commands)   Ice Chips Ice chips: Not tested   Thin Liquid Thin Liquid: Impaired Presentation: Spoon;Cup Oral Phase Impairments: Reduced labial seal Pharyngeal  Phase Impairments: Suspected delayed Swallow;Decreased hyoid-laryngeal movement;Multiple swallows    Nectar Thick Nectar Thick Liquid: Not tested   Honey Thick Honey Thick Liquid: Not tested   Puree Puree: Not tested   Solid     Solid: Not tested     Dimas Aguas, MA, CCC-SLP Acute Rehab SLP [phone]  Fleet Contras 04/29/2019,10:03 AM

## 2019-04-29 NOTE — Evaluation (Signed)
Occupational Therapy Evaluation Patient Details Name: [name] MRN: [id number] DOB: 12-04-1929 Today's Date: 04/29/2019    History of Present Illness This 86 y.o. female admitted after being found on the floor due to a fall, and subsequently decreased ability to communicate.  MRI of her brain revealed    multiple acute infarcts, most confluent in the anterior Lt MCA territory, but there is bil. MCA/PCA watershed type ischemia Greater on the Lt.  As well as underlying advanced bil. temporal lobe atrophy.  PMH includes advanced dementia, DM, retained ureteral stent, osteopenia, HNT, DDD    Clinical Impression   Pt admitted with above. She demonstrates the below listed deficits and will benefit from continued OT to maximize safety and independence with BADLs.  Pt was awake, but minimally responsive. She will pick at her gown, and did smile in response to a warm blanket being placed on her.  She requires total A for all aspects of ADLs and mobility.  Rt UE appears flaccid and she appears to have Lt gaze preference.  The chart indicates that pt lives with her son, but no family present.  She will likely require total A at discharge.  IF family able to provide this level of care, and wish to take her home, recommend hoyer lift and reclining w/c.  If they are unable to provide this level of assist, she will likely require LTC.  Will follow for a 2 week trial of OT       Follow Up Recommendations  SNF;Supervision/Assistance - 24 hour (see comments above)   Equipment Recommendations  Other (comment)(hoyer lift, reclining w/c )    Recommendations for Other Services       Precautions / Restrictions Precautions Precautions: Fall      Mobility Bed Mobility Overal bed mobility: Needs Assistance Bed Mobility: Sit to Supine;Supine to Sit     Supine to sit: Total assist Sit to supine: Total assist   General bed mobility comments: requires assist for all aspects   Transfers Overall transfer  level: Needs assistance Equipment used: 4-wheeled walker             General transfer comment: Pt unable to attempt with +1 assist     Balance Overall balance assessment: Needs assistance Sitting-balance support: Feet supported Sitting balance-Leahy Scale: Zero Sitting balance - Comments: Pt requires max A to maintain EOB sitting.  He tends to lean toward her Lt elbow.  She does not initiate balance responses, and presents with posterior and Lt lean                                    ADL either performed or assessed with clinical judgement   ADL Overall ADL's : Needs assistance/impaired Eating/Feeding: NPO   Grooming: Wash/dry hands;Wash/dry face;Oral care;Total assistance;Sitting;Bed level   Upper Body Bathing: Total assistance;Bed level;Sitting   Lower Body Bathing: Total assistance;Bed level   Upper Body Dressing : Total assistance;Bed level   Lower Body Dressing: Total assistance;Bed level   Toilet Transfer: Total assistance Toilet Transfer Details (indicate cue type and reason): unable  Toileting- Clothing Manipulation and Hygiene: Total assistance;Bed level       Functional mobility during ADLs: Total assistance;Maximal assistance General ADL Comments: Pt unable to engage in self care activities      Vision   Additional Comments: Pt initially appears to have Lt gaze preference.  She blinks to threat on Rt inconsistently.  After sitting EOB, eyes at midline      Perception Perception Comments: possible Rt neglect/inattention    Praxis Praxis Praxis tested?: Not tested Praxis-Other Comments: unable due assess due to limited interaction     Pertinent Vitals/Pain Pain Assessment: Faces Faces Pain Scale: No hurt     Hand Dominance (uncertain )   Extremity/Trunk Assessment Upper Extremity Assessment Upper Extremity Assessment: LUE deficits/detail;RUE deficits/detail RUE Deficits / Details: no active or spontaneous movement noted.  PROM WFL   RUE Coordination: decreased fine motor;decreased gross motor LUE Deficits / Details: pt spontaneously moving Lt UE elbow distally    Lower Extremity Assessment Lower Extremity Assessment: Defer to PT evaluation   Cervical / Trunk Assessment Cervical / Trunk Assessment: Kyphotic   Communication Communication Communication: Receptive difficulties;Expressive difficulties(pt non verbal )   Cognition Arousal/Alertness: Awake/alert Behavior During Therapy: Flat affect Overall Cognitive Status: Impaired/Different from baseline                                 General Comments: Pt follows no commands.  She is minimally interactive with her environment as she will pick at her gown, and she did smile at therapist in response to a warm blanket placed on her.  No other interactions noted    General Comments  RN present at end of session.  Pt incontinent of urine and was assisted with clean up at bed level     Exercises     Shoulder Instructions      Home Living Family/patient expects to be discharged to:: Private residence Living Arrangements: Children Available Help at Discharge: Family                             Additional Comments: Per chart, pt lives with sons who provide assist.  No family available at this time to clarify home set up or amount of assist available   Lives With: Son    Prior Functioning/Environment          Comments: pt unable to provide info         OT Problem List: Decreased strength;Decreased range of motion;Decreased activity tolerance;Impaired balance (sitting and/or standing);Impaired vision/perception;Decreased coordination;Decreased cognition;Decreased safety awareness;Decreased knowledge of use of DME or AE;Impaired tone;Impaired UE functional use      OT Treatment/Interventions: Self-care/ADL training;Neuromuscular education;Therapeutic activities;Cognitive remediation/compensation;Visual/perceptual  remediation/compensation;Patient/family education;Balance training    OT Goals(Current goals can be found in the care plan section) Acute Rehab OT Goals OT Goal Formulation: Patient unable to participate in goal setting Time For Goal Achievement: 05/13/19 Potential to Achieve Goals: Fair ADL Goals Pt Will Perform Grooming: with max assist;sitting Pt Will Transfer to Toilet: with max assist;squat pivot transfer;bedside commode Additional ADL Goal #1: Pt will maintain EOB sitting with mod A X 5 mins  OT Frequency: Min 2X/week   Barriers to D/C:            Co-evaluation              AM-PAC OT "6 Clicks" Daily Activity     Outcome Measure Help from another person eating meals?: Total Help from another person taking care of personal grooming?: Total Help from another person toileting, which includes using toliet, bedpan, or urinal?: Total Help from another person bathing (including washing, rinsing, drying)?: Total Help from another person to put on and taking off regular upper body clothing?: Total Help from another  person to put on and taking off regular lower body clothing?: Total 6 Click Score: 6   End of Session Nurse Communication: Mobility status  Activity Tolerance: Patient tolerated treatment well Patient left: in bed;with call bell/phone within reach;with bed alarm set  OT Visit Diagnosis: Cognitive communication deficit (R41.841) Symptoms and signs involving cognitive functions: Cerebral infarction                Time: 3606-7703 OT Time Calculation (min): 20 min Charges:  OT General Charges $OT Visit: 1 Visit OT Evaluation $OT Eval Moderate Complexity: 1 Mod  Eber Jones., OTR/L Acute Rehabilitation Services Pager [phone] Office [phone]   Jeani Hawking M 04/29/2019, 11:50 AM

## 2019-04-29 NOTE — Progress Notes (Signed)
  Echocardiogram 2D Echocardiogram has been performed.  [name] 04/29/2019, 9:26 AM

## 2019-04-29 NOTE — Evaluation (Signed)
Physical Therapy Evaluation Patient Details Name: [name] MRN: [id number] DOB: 1929-06-27 Today's Date: 04/29/2019   History of Present Illness  This 86 y.o. female admitted after being found on the floor due to a fall, and subsequently decreased ability to communicate.  MRI of her brain revealed    multiple acute infarcts, most confluent in the anterior Lt MCA territory, but there is bil. MCA/PCA watershed type ischemia Greater on the Lt.  As well as underlying advanced bil. temporal lobe atrophy.  PMH includes advanced dementia, DM, retained ureteral stent, osteopenia, HNT, DDD     Clinical Impression  Pt presents with profound limitations to functional mobility primarily due to cognitive deficits and inability to attend to/interact with external environment, and therefore unable to participate in therapy.  See OT eval notes as findings similar to PT's.  Recommend nursing to follow repositioning schedule for prevention of skin problems, and perform functional range of motion during daily care.  If family can provide 24 hour care safely and consistently, agree with OT to order hoyer lift; otherwise recommend care facility to meet basic needs and protect quality of life.  No PT needs at this time, however if mental status improves please reconsult.     Follow Up Recommendations No PT follow up;Supervision/Assistance - 24 hour    Equipment Recommendations  None recommended by PT    Recommendations for Other Services       Precautions / Restrictions Precautions Precautions: Fall      Mobility  Bed Mobility Overal bed mobility: Needs Assistance Bed Mobility: Rolling Rolling: Total assist   Supine to sit: Total assist Sit to supine: Total assist   General bed mobility comments: did not further assess, see OT eval notes  Transfers Overall transfer level: Needs assistance Equipment used: 4-wheeled walker             General transfer comment: did not further assess, see OT  eval of same date  Ambulation/Gait             General Gait Details: unable  Stairs            Wheelchair Mobility    Modified Rankin (Stroke Patients Only)       Balance Overall balance assessment: Needs assistance Sitting-balance support: Feet supported Sitting balance-Leahy Scale: Zero Sitting balance - Comments: Pt requires max A to maintain EOB sitting.  He tends to lean toward her Lt elbow.  She does not initiate balance responses, and presents with posterior and Lt lean                                      Pertinent Vitals/Pain Pain Assessment: Faces Faces Pain Scale: No hurt    Home Living Family/patient expects to be discharged to:: Private residence Living Arrangements: Children Available Help at Discharge: Family             Additional Comments: Per chart, pt lives with sons who provide assist.  No family available at this time to clarify home set up or amount of assist available     Prior Function           Comments: unable to determine due to pt cognitive deficits     Hand Dominance   Dominant Hand: (uncertain )    Extremity/Trunk Assessment   Upper Extremity Assessment Upper Extremity Assessment: Defer to OT evaluation RUE Deficits / Details: no active or  spontaneous movement noted.  PROM WFL  RUE Coordination: decreased fine motor;decreased gross motor LUE Deficits / Details: pt spontaneously moving Lt UE elbow distally     Lower Extremity Assessment Lower Extremity Assessment: RLE deficits/detail;LLE deficits/detail RLE Deficits / Details: no spontaneous or active movement noted; ROM full and nonpainful LLE Deficits / Details: minimal toe wiggle and slight repositioning of LLE with touch/tactile cues, and noted left hand scratching lightly at back of thigh; moderate stiffness v. incr tone    Cervical / Trunk Assessment Cervical / Trunk Assessment: Kyphotic  Communication   Communication: Other  (comment)(nonverbal)  Cognition Arousal/Alertness: Awake/alert Behavior During Therapy: Flat affect Overall Cognitive Status: Impaired/Different from baseline                                 General Comments: does not follow commands, is minimally active though did smile when PT thanked her       General Comments General comments (skin integrity, edema, etc.): findings not additive to OT exam    Exercises     Assessment/Plan    PT Assessment Patent does not need any further PT services  PT Problem List         PT Treatment Interventions      PT Goals (Current goals can be found in the Care Plan section)  Acute Rehab PT Goals Patient Stated Goal: unable PT Goal Formulation: Patient unable to participate in goal setting    Frequency     Barriers to discharge        Co-evaluation               AM-PAC PT "6 Clicks" Mobility  Outcome Measure Help needed turning from your back to your side while in a flat bed without using bedrails?: Total Help needed moving from lying on your back to sitting on the side of a flat bed without using bedrails?: Total Help needed moving to and from a bed to a chair (including a wheelchair)?: Total Help needed standing up from a chair using your arms (e.g., wheelchair or bedside chair)?: Total Help needed to walk in hospital room?: Total Help needed climbing 3-5 steps with a railing? : Total 6 Click Score: 6    End of Session   Activity Tolerance: Other (comment)(minimal activity) Patient left: in bed   PT Visit Diagnosis: Other symptoms and signs involving the nervous system (R29.898);Muscle weakness (generalized) (M62.81);Adult, failure to thrive (R62.7)    Time: 1211-1221 PT Time Calculation (min) (ACUTE ONLY): 10 min   Charges:   PT Evaluation $PT Eval Low Complexity: 1 Low          Kearney Hard, PT, DPT, MS Board Certified Geriatric Clinical Specialist   Herbie Drape 04/29/2019, 2:59  PM

## 2019-04-30 DIAGNOSIS — I639 Cerebral infarction, unspecified: Secondary | ICD-10-CM

## 2019-04-30 DIAGNOSIS — E876 Hypokalemia: Secondary | ICD-10-CM

## 2019-04-30 DIAGNOSIS — R8290 Unspecified abnormal findings in urine: Secondary | ICD-10-CM

## 2019-04-30 DIAGNOSIS — Z515 Encounter for palliative care: Secondary | ICD-10-CM

## 2019-04-30 MED ORDER — LORAZEPAM 2 MG/ML IJ SOLN: 1.0000 mg | Freq: Four times a day (QID) | INTRAMUSCULAR | Status: DC | PRN

## 2019-04-30 MED ORDER — MORPHINE SULFATE (PF) 2 MG/ML IV SOLN: 1.0000 mg | INTRAVENOUS | Status: DC | PRN

## 2019-04-30 NOTE — Consult Note (Signed)
Consultation Note Date: 04/30/2019   Patient Name: [name]  DOB: 1930-02-21  MRN: [id number]  Age / Sex: 86 y.o., female  PCP: Pearson Grippe, MD Referring Physician: Coralie Keens  Reason for Consultation: Establishing goals of care and Psychosocial/spiritual support  HPI/Patient Profile: 86 y.o. female   admitted on 04/28/2019 with past  medical history significant of hypertension, hyperlipidemia, diabetes mellitus type 2, nephrolithiasis with retained ureteral stent, and dementia.  She presents after being found to be acutely altered.  History obtained from one of the patient's sons over the phone.  She has dementia at baseline.   One of her sons helps care for her.  Yesterday morning she had an unwitnessed fall and was found on the floor. Since that time she had not been talkative or responding like normal.  A few weeks ago patient had a dizzy spell and almost fell.  To their knowledge she had no history of an irregular heart rhythm, but were previously told that she had slow heart rates that times.    ED Course: Upon admission into the emergency department patient was seen to have a blood pressure elevated up to 170/93,, but all other vital [name] maintained.  CT scan of the brain showed no acute or traumatic abnormalities.  Chest x-ray noted mild cardiomegaly without any acute abnormalities.  X-rays of the pelvis were negative for any acute fractures.  Labs significant for hemoglobin 10.4, potassium 3, calcium 7.1, anion gap 11, and albumin 3.1.  Urinalysis was positive for small hemoglobin, 20 ketones, trace leukocytes, rare bacteria, and 6-10 WBCs.  COVID-19 negative  Patient has continued to decline over the past many months physically, functionally and cognitively. Currently patient is nonverbal and unable to follow commands.  She is lethargic but can be stimulated with vigorous verbal and tactile  stimulation.  Breaths are shallow   Family face treatment option decisions, advanced directive decisions and anticipatory care needs.    Clinical Assessment and Goals of Care:  This NP Lorinda Creed reviewed medical records, received report from team, assessed the patient and then meet at the patient's bedside and spoke to son/ [name]  by phone  to discuss diagnosis, prognosis, GOC, EOL wishes disposition and options.  Concept of Hospice and Palliative Care were discussed  A detailed discussion was had today regarding advanced directives.  Concepts specific to code status, artifical feeding and hydration, continued IV antibiotics and rehospitalization was had.  The difference between a aggressive medical intervention path  and a palliative comfort care path for this patient at this time was had.  Values and goals of care important to patient and family were attempted to be elicited.  [name] after speaking with his 2 other brothers, he calls me back with decision to shift to full comfort. All family understand the seriousness of the current medical situation and the limited prognosis for their mother .  Family feel comfortable with her decision knowing that "this is what she would want"  Created space and opportunity for  [name] to explore his thoughts and feelings regarding his mother's current medical situation.  He understands where his mother is in her life trajectory and the hope is for comfort and dignity for her at this time.  Natural trajectory and expectations at EOL were discussed.  Questions and concerns addressed.   Family encouraged to call with questions or concerns.    PMT will continue to support holistically.   There is no documented healthcare power of attorney or advanced directive.  All 3 sons act in unison the best interest of their mother [name]    SUMMARY OF RECOMMENDATIONS    Code Status/Advance Care Planning:  DNR    Symptom Management:    Pain/dyspnea: Morphine 1 mg IV every 1 hours as needed   Agitation: Ativan 1 mg IV every 6 hours as needed  Palliative Prophylaxis:   Aspiration, Bowel Regimen, Eye Care, Frequent Pain Assessment and Oral Care  Additional Recommendations (Limitations, Scope, Preferences):  Full Comfort Care  Psycho-social/Spiritual:   Desire for further Chaplaincy support:yes  Additional Recommendations: Education on Hospice  Prognosis:   Hours - Days   Family tell me that they likely will not come to the hospital at this time wanting to "remember her as she was"  Discharge Planning: To Be Determined      Primary Diagnoses: Present on Admission: . Acute encephalopathy . Acute embolic stroke (HCC) . Abnormal urinalysis . Uncontrolled hypertension . Dementia (HCC) . (Resolved) Hypokalemia . (Resolved) Hypocalcemia . DNR (do not resuscitate)   I have reviewed the medical record, interviewed the patient and family, and examined the patient. The following aspects are pertinent.  Past Medical History:  Diagnosis Date  . DDD (degenerative disc disease), lumbar   . Dementia (HCC)   . Full dentures   . Hyperlipidemia   . Hypertension   . Memory loss   . Osteopenia   . Renal calculus, left   . Retained ureteral stent    RETAINED SINCE 2001  . Sigmoid diverticulosis   . Type 2 diabetes mellitus (HCC)   . Vitamin B12 deficiency    Social History   Socioeconomic History  . Marital status: Single    Spouse name: Not on file  . Number of children: 3  . Years of education: Not on file  . Highest education level: Not on file  Occupational History  . Occupation: retired  Tobacco Use  . Smoking status: Never Smoker  . Smokeless tobacco: Never Used  Substance and Sexual Activity  . Alcohol use: No    Alcohol/week: 0.0 standard drinks  . Drug use: No  . Sexual activity: Never  Other Topics Concern  . Not on file  Social History Narrative  . Not on file   Social  Determinants of Health   Financial Resource Strain:   . Difficulty of Paying Living Expenses: Not on file  Food Insecurity:   . Worried About Programme researcher, broadcasting/film/video in the Last Year: Not on file  . Ran Out of Food in the Last Year: Not on file  Transportation Needs:   . Lack of Transportation (Medical): Not on file  . Lack of Transportation (Non-Medical): Not on file  Physical Activity:   . Days of Exercise per Week: Not on file  . Minutes of Exercise per Session: Not on file  Stress:   . Feeling of Stress : Not on file  Social Connections:   . Frequency of Communication with Friends and Family: Not on file  .  Frequency of Social Gatherings with Friends and Family: Not on file  . Attends Religious Services: Not on file  . Active Member of Clubs or Organizations: Not on file  . Attends Banker Meetings: Not on file  . Marital Status: Not on file   Family History  Problem Relation Age of Onset  . Diabetes Mother   . Glaucoma Mother   . Heart disease Mother   . Cancer Brother        type unknown  . Hypertension Father   . CVA Father    Scheduled Meds: . aspirin  300 mg Rectal Daily  . cloNIDine  0.2 mg Transdermal Weekly  . heparin injection (subcutaneous)  5,000 Units Subcutaneous Q12H  . sodium chloride flush  3 mL Intravenous Q12H   Continuous Infusions: . dextrose 5 % and 0.45% NaCl 75 mL/hr at 04/30/19 0302   PRN Meds:.acetaminophen **OR** acetaminophen, albuterol, bismuth subsalicylate, hydrALAZINE, ondansetron **OR** ondansetron (ZOFRAN) IV Medications Prior to Admission:  Prior to Admission medications   Medication Sig Start Date End Date Taking? Authorizing Provider  bismuth subsalicylate (PEPTO BISMOL) 262 MG/15ML suspension Take 30 mLs by mouth every 6 (six) hours as needed for indigestion.   Yes [provider]  hydrALAZINE (APRESOLINE) 25 MG tablet Take 25 mg by mouth 2 (two) times daily.   Yes [provider]  ibuprofen  (ADVIL,MOTRIN) 200 MG tablet Take 400 mg by mouth every 8 (eight) hours as needed for moderate pain.   Yes [provider]  docusate sodium (COLACE) 100 MG capsule Take 1 capsule (100 mg total) by mouth 2 (two) times daily. Patient not taking: Reported on 04/28/2019 03/14/17   Glade Lloyd, MD  hydrochlorothiazide (HYDRODIURIL) 12.5 MG tablet Take 1 tablet (12.5 mg total) by mouth daily. Patient not taking: Reported on 04/28/2019 03/14/17   Glade Lloyd, MD  polyethylene glycol (MIRALAX / GLYCOLAX) packet Take 17 g by mouth daily as needed. Patient not taking: Reported on 04/28/2019 03/14/17   Glade Lloyd, MD   Allergies  Allergen Reactions  . Lisinopril Anaphylaxis  . Bactrim [Sulfamethoxazole-Trimethoprim] Other (See Comments)    AKI  . Codeine Nausea And Vomiting  . Keflex [Cephalexin] Swelling    Angioedema   Review of Systems  Unable to perform ROS: Acuity of condition    Physical Exam Constitutional:      Appearance: She is cachectic. She is ill-appearing.  Cardiovascular:     Rate and Rhythm: Bradycardia present.  Musculoskeletal:     Comments: Generalized weakness and muscle atrophy  Skin:    General: Skin is warm and dry.  Neurological:     Mental Status: She is lethargic.     Vital [name]: BP (!) 171/94 (BP Location: Right Arm)   Pulse (!) 51   Temp 97.8 F (36.6 C) (Axillary)   Resp 16   Ht 5\' 3"  (1.6 m)   Wt 40.8 kg   SpO2 98%   BMI 15.94 kg/m  Pain Scale: PAINAD POSS *See Group Information*: 1-Acceptable,Awake and alert     SpO2: SpO2: 98 % O2 Device:SpO2: 98 % O2 Flow Rate: .   IO: Intake/output summary:   Intake/Output Summary (Last 24 hours) at 04/30/2019 1132 Last data filed at 04/30/2019 0100 Gross per 24 hour  Intake 970 ml  Output 225 ml  Net 745 ml    LBM:   Baseline Weight: Weight: 40.8 kg Most recent weight: Weight: 40.8 kg     Palliative Assessment/Data: 20 %  Discussed with Dr Cathlean Sauer  Time In: 1200 Time Out:  1315 Time Total: 75 minutes Greater than 50%  of this time was spent counseling and coordinating care related to the above assessment and plan.  Signed by: Wadie Lessen, NP   Please contact Palliative Medicine Team phone at [phone] for questions and concerns.  For individual provider: See Shea Evans

## 2019-04-30 NOTE — Progress Notes (Signed)
PROGRESS NOTE    [name]  SHF:[id number] DOB: 10-08-1929 DOA: 04/28/2019 PCP: Kimberly Gravel, MD    Brief Narrative:  86 year old female who presented after a fall.  She does have significant past med history for dementia, hypertension, dyslipidemia, type 2 diabetes mellitus nephrolithiasis and retained ureteral stone.  Patient experienced a unwitnessed fall, was found on the floor, about 24 hours prior to hospitalization.  He was noted to be less reactive and responsive.  On the initial physical examination blood pressure 170/93, heart rate 73, respirate 16, temperature 98.3, oxygen saturation 90%, her lungs were clear to auscultation bilaterally, heart S1-S2 present, regular rate regular, abdomen soft, lower extremity edema, noted right sided hemiparesis.  Head CT with positive atrophy, progressive since 2018, no acute findings.  Brain MRI with multiple acute infarcts, most confluent in the anterior left MCA territory. Positive bilateral MCA/PCA watershed type ischemia.  Underlying advanced bilateral temporal lobe atrophy.  Patient was admitted to the hospital with working diagnosis acute encephalopathy due to acute CVA in the setting of dementia.  Patient continue with poor response and persistent encephalopathy, palliative care team consulted and patient changed to comfort care. Plan for possible hospice in am.   Assessment & Plan:   Principal Problem:   Acute embolic stroke [facility]) Active Problems:   Acute encephalopathy   Dementia ([place])   Uncontrolled hypertension   Abnormal urinalysis   DNR (do not resuscitate)   1.  Acute multiple CVAs, anterior left MCA territory. Global aphasia and right hemiplegia. Suspected embolic CVA, patient with persistent swallow dysfunction with high aspiration risk, recommendations for NPO per speech therapy. Patient with very poor prognosis, her family has decided to continue care under comfort care. Currently on slow rate of IV dextrose and as needed  analgesics and anxiolytics.   2. Dementia with ischemic encephalopathy . Her quality of life has been significantly affected by current CVA. Very poor prognosis, continue with palliative care.   UA with 6 to 10 wbc, unclear for urinary symptoms, patient's clinical presentation can be explained by acute CVA. Will rule out UTI.   3. HTN. Continue close monitoring blood pressure, not candidate for medical therapy at this point.   DVT prophylaxis: comfort care    Code Status:  dnr  Family Communication: no family at the bedside  Disposition Plan/ discharge barriers: pending hospice arrangements.    Consultants:   Neurology   Palliative Care       Subjective: Patient is poorly interactive  Objective: Vitals:   04/30/19 0021 04/30/19 0454 04/30/19 0833 04/30/19 1204  BP: (!) 150/82 (!) 157/83 (!) 171/94 (!) 160/92  Pulse: 78 (!) 59 (!) 51 (!) 57  Resp: 18 18 16 16   Temp: 99.2 F (37.3 C) 97.7 F (36.5 C) 97.8 F (36.6 C) 98.2 F (36.8 C)  TempSrc: Oral Oral Axillary Axillary  SpO2: 97% 100% 98% 98%  Weight:      Height:        Intake/Output Summary (Last 24 hours) at 04/30/2019 1228 Last data filed at 04/30/2019 0100 Gross per 24 hour  Intake 970 ml  Output 225 ml  Net 745 ml   Filed Weights   04/28/19 0210  Weight: 40.8 kg    Examination:   General: deconditioned and ill looking appearing  Neurology: Awake, but not interactive.  E ENT: mild pallor, no icterus, oral mucosa moist Cardiovascular: No JVD. S1-S2 present, rhythmic. No lower extremity edema. Pulmonary: positive breath sounds bilaterally. Gastrointestinal. Abdomen with no organomegaly,  non tender, no rebound or guarding Skin. No rashes Musculoskeletal: no joint deformities     Data Reviewed: I have personally reviewed following labs and imaging studies  CBC: Recent Labs  Lab 04/28/19 0212 04/29/19 0449  WBC 4.2 5.6  HGB 10.4* 11.0*  HCT 30.9* 31.9*  MCV 84.9 82.2  PLT 94* 91*   Basic  Metabolic Panel: Recent Labs  Lab 04/28/19 0212 04/28/19 1140 04/29/19 0449  NA 144  --  139  K 3.0*  --  3.5  CL 115*  --  105  CO2 18*  --  24  GLUCOSE 110*  --  85  BUN 13  --  8  CREATININE 0.76  --  0.79  CALCIUM 7.7*  --  9.0  MG  --  1.5*  --    GFR: Estimated Creatinine Clearance: 31.9 mL/min (by C-G formula based on SCr of 0.79 mg/dL). Liver Function Tests: Recent Labs  Lab 04/28/19 0212  AST 15  ALT 9  ALKPHOS 35*  BILITOT 0.7  PROT 5.6*  ALBUMIN 3.1*   No results for input(s): LIPASE, AMYLASE in the last 168 hours. No results for input(s): AMMONIA in the last 168 hours. Coagulation Profile: No results for input(s): INR, PROTIME in the last 168 hours. Cardiac Enzymes: No results for input(s): CKTOTAL, CKMB, CKMBINDEX, TROPONINI in the last 168 hours. BNP (last 3 results) No results for input(s): PROBNP in the last 8760 hours. HbA1C: Recent Labs    04/28/19 1140  HGBA1C 4.5*   CBG: No results for input(s): GLUCAP in the last 168 hours. Lipid Profile: Recent Labs    04/28/19 1140  CHOL 269*  HDL 57  LDLCALC 202*  TRIG 48  CHOLHDL 4.7   Thyroid Function Tests: Recent Labs    04/28/19 1140  TSH 1.363   Anemia Panel: No results for input(s): VITAMINB12, FOLATE, FERRITIN, TIBC, IRON, RETICCTPCT in the last 72 hours.    Radiology Studies: I have reviewed all of the imaging during this hospital visit personally     Scheduled Meds: . aspirin  300 mg Rectal Daily  . cloNIDine  0.2 mg Transdermal Weekly  . heparin injection (subcutaneous)  5,000 Units Subcutaneous Q12H  . sodium chloride flush  3 mL Intravenous Q12H   Continuous Infusions: . dextrose 5 % and 0.45% NaCl 75 mL/hr at 04/30/19 0302     LOS: 2 days        [name] Kimberly Gula, MD

## 2019-04-30 NOTE — Progress Notes (Signed)
Physical Therapy DISCHARGE Note  PT evaluation completed 04/29/2019 in which  Therapist d/c patient from skilled acute PT services as pt non-interactive and totalAx2. Confirmed with RN today patient has not changed. PT SIGNING OFF on patient at this time and recommending SNF upon d/c as pt was at home PTA and now requires totalAX2 and has severe cognitive deficits and is unable to care for self. If patient demonstrates improved cognitive status then please re-consult PT. Thank you!  Lewis Shock, PT, DPT Acute Rehabilitation Services Pager #: [phone] Office #: [phone]

## 2019-04-30 NOTE — Progress Notes (Signed)
   04/30/19 1530  Clinical Encounter Type  Visited With Patient  Visit Type Follow-up  Referral From Nurse  Consult/Referral To Chaplain  Spiritual Encounters  Spiritual Needs Other (Comment) (End of Life)  Stress Factors  Patient Stress Factors Health changes  Family Stress Factors Major life changes  Chaplain responded to end of life consult. [name] was alert and made facial expressions in response to Chaplain's voice. Not sure she was able to fully understand what Chaplain was saying. Chaplain visited briefly with [name]. No family present. Chaplain will refer to unit Chaplain for follow up care Tuesday May 01, 2019. Chaplains remain available for support as needs arise.   Chaplain Resident, Amado Coe, M Div [phone] on-call pager

## 2019-04-30 NOTE — Progress Notes (Signed)
  Speech Language Pathology Treatment: Dysphagia;Cognitive-Linquistic  Patient Details Name: [name] MRN: [id number] DOB: [birth year] Today's Date: 04/30/2019 Time: 1000-1010 SLP Time Calculation (min) (ACUTE ONLY): 10 min  Assessment / Plan / Recommendation Clinical Impression  Pt demonstrates no recognition of PO. She is alert, defensive despite patient calming techniques and attempts to facilitate self care (giving her a cloth to wipe her mouth, trying to hand her a spoon or cup for self feeding). Pt would not accept anything orally, turing head and sealing mouth. When a taste of ice cream or an ice was put into pts mouth she had severe anterior spillage on the right due to CN VII weakness, but also actively spit out the bolus. She showed no interest in a cup of ice water. RN informed SLP that per a prior discussion the family would not want an alternate method of nutrition. In that case, comfort feeding and offering pt puree and sips of water would be appropriate. Perhaps increased access would improve pts response, though risk of aspiration will be present and prognosis for adequate nutritional support is poor.    HPI HPI: [name] is a 86 y.o. female with medical history significant of hypertension, hyperlipidemia, diabetes mellitus type 2, nephrolithiasis with retained ureteral stent, and dementia.  She presents after being found to be acutely altered.  History obtained from one of the patient's sons over the phone.  She has dementia at baseline, but was able to hold a conversation. One of her sons helps care for her.  Yesterday morning she had an unwitnessed fall and was found on the floor. Since that time she had not been talkative or responding like normal.  A few weeks ago patient had a dizzy spell and almost fell.  To their knowledge she had no history of an irregular heart rhythm, but were previously told that she had slow heart rates that times.  She is not on any form of  anticoagulation.  MRI of the brain is showing the following:  multiple acute infarcts, most confluent in the anterior Left MCA territory. But there is bilateral MCA/PCA watershed type ischemia greater on the left.  Given sparing of the posterior fossa and deep gray nuclei this might reflect sequelae of hypertension superimposed on intracranial atherosclerosis rather than an embolic event.  2. No associated acute hemorrhage or mass effect.  3. Underlying advanced bilateral temporal lobe atrophy.  CXR was showing no active disease.        SLP Plan  Continue with current plan of care       Recommendations  Diet recommendations: NPO;Other(comment)(If family wanted to attempt comfort feeding, dys1/thin) Medication Administration: Other (Comment)                Oral Care Recommendations: Oral care QID Follow up Recommendations: Skilled Nursing facility SLP Visit Diagnosis: Aphasia (R47.01) Plan: Continue with current plan of care       GO                Tarence Searcy, Riley Nearing 04/30/2019, 10:51 AM

## 2019-04-30 NOTE — Progress Notes (Signed)
Informed by Dr. Ella Jubilee that family has decided full comfort care as per pt wishes, we will sign off at this time. Please call with further questions. Thanks for the consult.  Marvel Plan, MD PhD Stroke Neurology 04/30/2019 1:59 PM

## 2019-05-01 DIAGNOSIS — R52 Pain, unspecified: Secondary | ICD-10-CM

## 2019-05-01 DIAGNOSIS — Z66 Do not resuscitate: Secondary | ICD-10-CM

## 2019-05-01 MED ORDER — LORAZEPAM 1 MG PO TABS: 1.0000 mg | ORAL_TABLET | ORAL | Status: DC | PRN

## 2019-05-01 MED ORDER — MORPHINE SULFATE (CONCENTRATE) 10 MG/0.5ML PO SOLN
5.0000 mg | ORAL | Status: DC
  Administered 2019-05-01 – 2019-05-05 (×26): 5 mg via ORAL
  Filled 2019-05-01 (×26): qty 0.5

## 2019-05-01 MED ORDER — MORPHINE SULFATE (CONCENTRATE) 10 MG/0.5ML PO SOLN: 5.0000 mg | Freq: Four times a day (QID) | ORAL | Status: DC

## 2019-05-01 NOTE — Progress Notes (Signed)
Patient ID: [name], female   DOB: [birth year], 86 y.o.   MRN: [id number]    This NP visited patient at the bedside as a follow up to  yesterday's GOCs meeting.  Patient is lethargic, non-verbal, unable to follow commands and able to take oral intake.    Again I spoke to her son [name] regarding the current medical situation.   He understands the limited prognosis and the focus of care is comfort and dignity.  Plan of care: -DNR/DNI -No artificial feeding or hydration now or in the future -No life prolonging measures, allow for a natural death -Symptom management to enhance comfort      -Roxanol 5 mg scheduled every 4 hours for generalized discomfort      -Ativan 1 mg p.o./sublingual every 6 hours as needed for anxiety -Family is hopeful for residential hospice for end-of-life care, they are requesting [facility]  Discussed with family the natural trajectory and expectations at end of life.  Questions and concerns addressed.    Discussed with Dr Ella Jubilee and bedside RN  Total time spent on the unit was 35 minutes  Greater than 50% of the time was spent in counseling and coordination of care  Lorinda Creed NP  Palliative Medicine Team Team Phone # [phone] Pager [phone]

## 2019-05-01 NOTE — Progress Notes (Signed)
Manufacturing systems engineer [facility]) Hospital Liaison note.    Received request from [facility] manager for family interest in [facility]. [facility] is unable to offer a room today. Voicemail left for family. Hospital Liaison will follow up tomorrow or sooner if a room becomes available.    A Please do not hesitate to call with questions.    Thank you,    Elsie Saas, RN, CCM      [facility] Liaison (listed on [facility] under [facility] [place])    [phone]

## 2019-05-01 NOTE — Discharge Summary (Signed)
Physician Discharge Summary  [name] ZOX:[id number] DOB: [birth year] DOA: 04/28/2019  PCP: Pearson Grippe, MD  Admit date: 04/28/2019 Discharge date: 05/01/2019  Admitted From: Home  Disposition:  Hospice  Recommendations for Outpatient Follow-up and new medication changes:  1. Follow up with hospice team and Dr Selena Batten as scheduled.  2. Patient on comfort measures.   Home Health: na  Equipment/Devices: na    Discharge Condition: stable  CODE STATUS: DNR   Diet recommendation: as tolerated.   Brief/Interim Summary:  Patient was admitted to the hospital with the working diagnosis acute encephalopathy due to acute CVA in the setting of advanced dementia.  86 year old female who presented after a fall.  She does have significant past med history for dementia, hypertension, dyslipidemia, type 2 diabetes mellitus, nephrolithiasis and retained ureteral stone.  Patient experienced a unwitnessed fall, she was found on the floor. About 24 hours prior to hospitalization, she was noted to be less reactive and responsive.  On the initial physical examination blood pressure 170/93, heart rate 73, respiratory rate 16, temperature 98.3, oxygen saturation 90%, her lungs were clear to auscultation bilaterally, heart S1-S2 present, regular rate and rhythm, abdomen soft, no lower extremity edema, noted right sided hemiparesis.  Sodium 144, potassium 3.0 chloride 115 bicarb 18, glucose 110, white count 4.2, hemoglobin 10.4, hematocrit 30.9, platelets 94.  SARS COVID-19 negative.  Urine analysis specific gravity 1.015, 0-5 white cells, negative nitrates, 6-10 white cells.  Chest radiograph with no infiltrates.  Pelvic films with no acute osseous abnormality.  EKG 77 bpm, left axis deviation, left anterior fascicular block, sinus rhythm, no ST segment or T wave changes.  Head CT with positive atrophy, progressive since 2018, no acute findings.  Brain MRI with multiple acute infarcts, most confluent in the anterior left  MCA territory. Positive bilateral MCA/PCA watershed type ischemia.  Underlying advanced bilateral temporal lobe atrophy.  Patient continue with poor responsiveness and persistent encephalopathy, palliative care team consulted and patient was changed to comfort care. Pending transfer to residential hospice.   1.  Acute multiple CVAs, anterior left MCA territory.  Global aphasia with right hemiplegia.  Suspected embolic phenomenon.  Patient with high aspiration risk, has remained nothing by mouth per speech therapy recommendations.  Patient was evaluated by neurology, determined to have very poor prognosis.  Palliative care services were consulted patient was transitioned to comfort care.  No further medical therapy indicated.   2.  Dementia with acute ischemic encephalopathy.  Patient is awake but not interactive, global aphasia, poor prognosis.  3.  Hypertension.  Her blood pressure has remained stable, no antihypertensive agents.  Abnormal urinalysis, unclear urinary symptoms, patient's clinical presentation can be explained by acute ischemic CVA.  UTI ruled out.  I spoke over the phone with the patient's son about patient's  condition, plan of care, prognosis and all questions were addressed.  Discharge Diagnoses:  Principal Problem:   Acute embolic stroke [facility]) Active Problems:   Acute encephalopathy   Dementia (HCC)   Uncontrolled hypertension   Abnormal urinalysis   DNR (do not resuscitate)   Palliative care by specialist    Discharge Instructions   Allergies as of 05/01/2019      Reactions   Lisinopril Anaphylaxis   Bactrim [sulfamethoxazole-trimethoprim] Other (See Comments)   AKI   Codeine Nausea And Vomiting   Keflex [cephalexin] Swelling   Angioedema      Medication List    STOP taking these medications   bismuth subsalicylate 262 MG/15ML suspension Commonly  known as: PEPTO BISMOL   docusate sodium 100 MG capsule Commonly known as: COLACE   hydrALAZINE 25  MG tablet Commonly known as: APRESOLINE   hydrochlorothiazide 12.5 MG tablet Commonly known as: HYDRODIURIL   ibuprofen 200 MG tablet Commonly known as: ADVIL   polyethylene glycol 17 g packet Commonly known as: MIRALAX / GLYCOLAX       Allergies  Allergen Reactions  . Lisinopril Anaphylaxis  . Bactrim [Sulfamethoxazole-Trimethoprim] Other (See Comments)    AKI  . Codeine Nausea And Vomiting  . Keflex [Cephalexin] Swelling    Angioedema    Consultations:  Neurology   Palliative Care    Procedures/Studies: CT ANGIO HEAD W OR WO CONTRAST  Addendum Date: 04/28/2019   ADDENDUM REPORT: 04/28/2019 12:14 ADDENDUM: Study discussed by telephone with Dr. Madelyn Flavors with the hospitalist service on 04/28/2019 at 1211 hours. Electronically Signed   By: Odessa Fleming M.D.   On: 04/28/2019 12:14   Result Date: 04/28/2019 CLINICAL DATA:  86 year old found down with mental status changes. Acute left MCA and also bilateral MCA/PCA watershed area infarcts on brain MRI today. EXAM: CT ANGIOGRAPHY HEAD AND NECK TECHNIQUE: Multidetector CT imaging of the head and neck was performed using the standard protocol during bolus administration of intravenous contrast. Multiplanar CT image reconstructions and MIPs were obtained to evaluate the vascular anatomy. Carotid stenosis measurements (when applicable) are obtained utilizing NASCET criteria, using the distal internal carotid diameter as the denominator. CONTRAST:  57mL OMNIPAQUE IOHEXOL 350 MG/ML SOLN COMPARISON:  Brain MRI and head CT. Earlier today. FINDINGS: CTA NECK Skeleton: Absent dentition. No acute osseous abnormality identified. Upper chest: Small volume retained secretions in the trachea on series 5, image 146. Visible upper lungs are clear. Negative visible superior mediastinum. Other neck: Negative. No acute findings. Aortic arch: Good contrast timing. Four vessel arch configuration, the left vertebral artery arises directly from the arch. Ectatic  arch with minimal atherosclerosis. Right carotid system: Negative brachiocephalic artery and right CCA origin aside from mild tortuosity. Tortuous proximal right CCA with a kinked appearance at the thoracic inlet and retropharyngeal course. Capacious right carotid bifurcation. Partially retropharyngeal cervical right ICA without plaque or stenosis. Left carotid system: Negative left CCA origin. Mildly tortuous left CCA. Capacious and partially retropharyngeal left carotid bifurcation. Retropharyngeal cervical left ICA is tortuous without plaque or stenosis. Vertebral arteries: Ectatic proximal right subclavian artery with minimal plaque and no stenosis. Capacious right vertebral artery origin. The right V1 segment tapers, and the vessel has a very late entry into the cervical transverse foramen, diminutive up until the the more distal V2 segment. However, the vessel is occluded in the V3 segment and/or terminates in upper cervical muscular branches. The left vertebral artery arises directly from the arch with no plaque or stenosis. The left vertebral has a fairly normal caliber and is patent to the skull base without plaque or stenosis. CTA HEAD Posterior circulation: The left vertebral artery supplies the basilar with severe stenosis in the distal V4 segment on series 7, image 20 and series 11, image 23. The right AICA is patent and probably dominant. There is no distal right vertebral artery enhancement. Patent basilar artery with moderate mid basilar irregularity and stenosis. The distal basilar is also tapered with moderate stenosis (series 11, image 22). But the tip remains patent with fetal type bilateral PCA origins. The right posterior communicating artery and proximal right PCA are within normal limits. There is a mild to moderate right P2 segment stenosis on series 12,  image 14. The left posterior communicating artery and left PCA are highly atherosclerotic and demonstrates severe tandem stenoses through  the P2 and proximal P3 segments (series 12, image 18). There is distal PCA enhancement. Anterior circulation: Both ICA siphons are patent and ectatic without plaque or stenosis. Normal ophthalmic and posterior communicating artery origins. Patent carotid termini. Normal MCA and left ACA origins. Left A1 is dominant and the right diminutive. Anterior communicating artery and bilateral ACA branches are within normal limits. Right MCA M1 segment, bifurcation, and proximal right MCA branches are within normal limits. There is mild distal right MCA branch irregularity and stenosis. The left MCA M1 segment is occluded about 18 mm distal to its origin near the left MCA bifurcation. There are some reconstituted left MCA branches but they are diminutive (series 12, image 23). Venous sinuses: Early contrast timing but grossly patent. Anatomic variants: Left vertebral artery arises directly from the arch and supplies the basilar due to either distal right vertebral artery occlusion or terminating outside the skull in muscular branches. Fetal type bilateral PCA origins. Dominant left and diminutive right ACA A1 segments. Review of the MIP images confirms the above findings IMPRESSION: 1. Positive for Emergent Large Vessel Occlusion: Left MCA distal M1 segment. Some reconstituted Left MCA branches, but they are diminutive. 2. Positive also for superimposed intracranial atherosclerosis, including tandem severe Left and moderate Right PCA stenoses. There are also severe stenoses of the Left Vertebral Artery V4 (sole supply to the Basilar) and moderate Basilar Artery stenoses. 3. Additionally, the right vertebral artery either functionally terminates outside the skull in muscular branches or is occluded distally. 4. Carotid artery ectasia without plaque or stenosis. Ectatic aortic arch without significant plaque. 5. Small volume retained secretions in the trachea. Electronically Signed: By: Odessa Fleming M.D. On: 04/28/2019 12:05   DG  Chest 1 View  Result Date: 04/28/2019 CLINICAL DATA:  Fall EXAM: CHEST  1 VIEW COMPARISON:  None. FINDINGS: There is mild cardiomegaly. Aortic knob calcifications. There is slight hyperinflation of the upper lung zones. No focal airspace consolidation or pleural effusion. No acute osseous abnormality. IMPRESSION: No active disease. Electronically Signed   By: Jonna Clark M.D.   On: 04/28/2019 03:34   DG Pelvis 1-2 Views  Result Date: 04/28/2019 CLINICAL DATA:  Fall EXAM: PELVIS - 1-2 VIEW COMPARISON:  None. FINDINGS: There is no evidence of pelvic fracture or diastasis. There is diffuse osteopenia. Overlying bowel gas and stool seen within the deep pelvis. Degenerative changes in the lower lumbar spine. IMPRESSION: No definite acute osseous abnormality. Electronically Signed   By: Jonna Clark M.D.   On: 04/28/2019 03:33   CT HEAD WO CONTRAST  Result Date: 04/29/2019 CLINICAL DATA:  Stroke follow-up. Left brain infarctions diagnosed yesterday. EXAM: CT HEAD WITHOUT CONTRAST TECHNIQUE: Contiguous axial images were obtained from the base of the skull through the vertex without intravenous contrast. COMPARISON:  CT and MR studies 04/28/2019 FINDINGS: Brain: Developing low-density in the region of infarction primarily in the left hemisphere and most prominently in the left frontal operculum ir region and frontoparietal cortical and subcortical brain. Early mild swelling but no mass effect. No hemorrhagic transformation. Slightly more prominent low-density in the deep white matter of the right parietal lobe in the area of smaller acute infarctions. No new area of brain involvement is seen. No hydrocephalus. No shift. Vascular: No vascular finding by CT. Skull: Negative Sinuses/Orbits: Clear/normal.  Previous lens implants. Other: None IMPRESSION: Developing low-density in the areas of acute  infarction more extensively in the left MCA territory than the right. No evidence of hemorrhage or significant mass  effect/shift. Electronically Signed   By: Paulina Fusi M.D.   On: 04/29/2019 00:20   CT Head Wo Contrast  Result Date: 04/28/2019 CLINICAL DATA:  Found on the floor tonight.  Mental status changes. EXAM: CT HEAD WITHOUT CONTRAST TECHNIQUE: Contiguous axial images were obtained from the base of the skull through the vertex without intravenous contrast. COMPARISON:  2017/04/08 FINDINGS: Brain: Pronounced generalized brain atrophy, progressive since 2018. There is temporal lobe predominance. Chronic small-vessel ischemic changes affect the hemispheric white matter. No sign of acute infarction, mass lesion, hemorrhage, hydrocephalus or extra-axial collection. Vascular: There is atherosclerotic calcification of the major vessels at the base of the brain. Skull: No skull fracture. Sinuses/Orbits: Clear/normal Other: None IMPRESSION: No acute or traumatic finding. Brain atrophy, progressive since 2018. Temporal lobe predominance, as can be seen with Alzheimer's dementia. Electronically Signed   By: Paulina Fusi M.D.   On: 04/28/2019 03:43   CT ANGIO NECK W OR WO CONTRAST  Addendum Date: 04/28/2019   ADDENDUM REPORT: 04/28/2019 12:14 ADDENDUM: Study discussed by telephone with Dr. Madelyn Flavors with the hospitalist service on 04/28/2019 at 1211 hours. Electronically Signed   By: Odessa Fleming M.D.   On: 04/28/2019 12:14   Result Date: 04/28/2019 CLINICAL DATA:  86 year old found down with mental status changes. Acute left MCA and also bilateral MCA/PCA watershed area infarcts on brain MRI today. EXAM: CT ANGIOGRAPHY HEAD AND NECK TECHNIQUE: Multidetector CT imaging of the head and neck was performed using the standard protocol during bolus administration of intravenous contrast. Multiplanar CT image reconstructions and MIPs were obtained to evaluate the vascular anatomy. Carotid stenosis measurements (when applicable) are obtained utilizing NASCET criteria, using the distal internal carotid diameter as the denominator.  CONTRAST:  45mL OMNIPAQUE IOHEXOL 350 MG/ML SOLN COMPARISON:  Brain MRI and head CT. Earlier today. FINDINGS: CTA NECK Skeleton: Absent dentition. No acute osseous abnormality identified. Upper chest: Small volume retained secretions in the trachea on series 5, image 146. Visible upper lungs are clear. Negative visible superior mediastinum. Other neck: Negative. No acute findings. Aortic arch: Good contrast timing. Four vessel arch configuration, the left vertebral artery arises directly from the arch. Ectatic arch with minimal atherosclerosis. Right carotid system: Negative brachiocephalic artery and right CCA origin aside from mild tortuosity. Tortuous proximal right CCA with a kinked appearance at the thoracic inlet and retropharyngeal course. Capacious right carotid bifurcation. Partially retropharyngeal cervical right ICA without plaque or stenosis. Left carotid system: Negative left CCA origin. Mildly tortuous left CCA. Capacious and partially retropharyngeal left carotid bifurcation. Retropharyngeal cervical left ICA is tortuous without plaque or stenosis. Vertebral arteries: Ectatic proximal right subclavian artery with minimal plaque and no stenosis. Capacious right vertebral artery origin. The right V1 segment tapers, and the vessel has a very late entry into the cervical transverse foramen, diminutive up until the the more distal V2 segment. However, the vessel is occluded in the V3 segment and/or terminates in upper cervical muscular branches. The left vertebral artery arises directly from the arch with no plaque or stenosis. The left vertebral has a fairly normal caliber and is patent to the skull base without plaque or stenosis. CTA HEAD Posterior circulation: The left vertebral artery supplies the basilar with severe stenosis in the distal V4 segment on series 7, image 20 and series 11, image 23. The right AICA is patent and probably dominant. There is no distal  right vertebral artery enhancement.  Patent basilar artery with moderate mid basilar irregularity and stenosis. The distal basilar is also tapered with moderate stenosis (series 11, image 22). But the tip remains patent with fetal type bilateral PCA origins. The right posterior communicating artery and proximal right PCA are within normal limits. There is a mild to moderate right P2 segment stenosis on series 12, image 14. The left posterior communicating artery and left PCA are highly atherosclerotic and demonstrates severe tandem stenoses through the P2 and proximal P3 segments (series 12, image 18). There is distal PCA enhancement. Anterior circulation: Both ICA siphons are patent and ectatic without plaque or stenosis. Normal ophthalmic and posterior communicating artery origins. Patent carotid termini. Normal MCA and left ACA origins. Left A1 is dominant and the right diminutive. Anterior communicating artery and bilateral ACA branches are within normal limits. Right MCA M1 segment, bifurcation, and proximal right MCA branches are within normal limits. There is mild distal right MCA branch irregularity and stenosis. The left MCA M1 segment is occluded about 18 mm distal to its origin near the left MCA bifurcation. There are some reconstituted left MCA branches but they are diminutive (series 12, image 23). Venous sinuses: Early contrast timing but grossly patent. Anatomic variants: Left vertebral artery arises directly from the arch and supplies the basilar due to either distal right vertebral artery occlusion or terminating outside the skull in muscular branches. Fetal type bilateral PCA origins. Dominant left and diminutive right ACA A1 segments. Review of the MIP images confirms the above findings IMPRESSION: 1. Positive for Emergent Large Vessel Occlusion: Left MCA distal M1 segment. Some reconstituted Left MCA branches, but they are diminutive. 2. Positive also for superimposed intracranial atherosclerosis, including tandem severe Left and  moderate Right PCA stenoses. There are also severe stenoses of the Left Vertebral Artery V4 (sole supply to the Basilar) and moderate Basilar Artery stenoses. 3. Additionally, the right vertebral artery either functionally terminates outside the skull in muscular branches or is occluded distally. 4. Carotid artery ectasia without plaque or stenosis. Ectatic aortic arch without significant plaque. 5. Small volume retained secretions in the trachea. Electronically Signed: By: Genevie Ann M.D. On: 04/28/2019 12:05   MR BRAIN WO CONTRAST  Result Date: 04/28/2019 CLINICAL DATA:  86 year old female found down with mental status changes. EXAM: MRI HEAD WITHOUT CONTRAST TECHNIQUE: Multiplanar, multiecho pulse sequences of the brain and surrounding structures were obtained without intravenous contrast. COMPARISON:  Head CT 0331 hours today. FINDINGS: Brain: Patchy and confluent restricted diffusion in the left hemisphere is most pronounced in the anterior MCA territory (series 7, image 53) including confluent involvement of the operculum and anterior left insula. However, there is also multifocal patchy left PCA and PCA/MCA watershed area ischemia with infarcts (left occipital lobe series 7, image 50). There are also mild contralateral right posterior MCA/PCA watershed area white matter infarcts (series 7, image 51). Sparing of the deep gray nuclei, brainstem and cerebellum. Associated T2 and FLAIR hyperintensity in the acutely affected areas with no mass effect or definite acute hemorrhage. There are occasional chronic micro hemorrhages including in the right periatrial white matter on series 12, image 14. There is patchy additional bilateral white matter T2 and FLAIR hyperintensity. There is chronic atrophy and/or encephalomalacia in both anterior temporal lobes with pronounced mesial temporal volume loss (series 11, image 10 and series 14, image 14). But no other cortical encephalomalacia. Ex vacuo ventricular enlargement.  No midline shift, mass effect, evidence of mass lesion, extra-axial collection  or acute intracranial hemorrhage. Cervicomedullary junction and pituitary are within normal limits. Vascular: Major intracranial vascular flow voids are preserved. Skull and upper cervical spine: Negative visible cervical spine. Normal bone marrow signal. Sinuses/Orbits: Postoperative changes to the globes. Otherwise grossly negative orbits and paranasal sinuses. Other: Mastoids are clear. Scalp and face soft tissues appear negative. IMPRESSION: 1. Positive for multiple acute infarcts, most confluent in the anterior Left MCA territory. But there is bilateral MCA/PCA watershed type ischemia greater on the left. Given sparing of the posterior fossa and deep gray nuclei this might reflect sequelae of hypertension superimposed on intracranial atherosclerosis rather than an embolic event. 2. No associated acute hemorrhage or mass effect. 3. Underlying advanced bilateral temporal lobe atrophy. Electronically Signed   By: Odessa FlemingH  Hall M.D.   On: 04/28/2019 10:18   ECHOCARDIOGRAM COMPLETE  Result Date: 04/29/2019   ECHOCARDIOGRAM REPORT   Patient Name:   [name] Date of Exam: 04/29/2019 Medical Rec #:  [id number]     Height:       63.0 in Accession #:    [id number]    Weight:       90.0 lb Date of Birth:  28-Apr-1929     BSA:          1.38 m Patient Age:    86 years      BP:           175/88 mmHg Patient Gender: F             HR:           52 bpm. Exam Location:  Inpatient Procedure: 2D Echo Indications:    Stroke  History:        Patient has no prior history of Echocardiogram examinations.                 Risk Factors:Hypertension, Diabetes and Dyslipidemia.  Sonographer:    Ross LudwigArthur Guy RDCS (AE) Referring Phys: [id number] [facility] LtdRONDELL A SMITH  Sonographer Comments: No parasternal window and Technically difficult study due to poor echo windows. IMPRESSIONS  1. Left ventricular ejection fraction, by visual estimation, is 60 to 65%. The left ventricle has  normal function. There is no left ventricular hypertrophy.  2. Elevated left ventricular end-diastolic pressure.  3. Left ventricular diastolic parameters are consistent with Grade I diastolic dysfunction (impaired relaxation).  4. The left ventricle has no regional wall motion abnormalities.  5. Global right ventricle has normal systolic function.The right ventricular size is normal. No increase in right ventricular wall thickness.  6. Left atrial size was normal.  7. Right atrial size was mild-moderately dilated.  8. The mitral valve is normal in structure. Trivial mitral valve regurgitation. No evidence of mitral stenosis.  9. The tricuspid valve is normal in structure. Tricuspid valve regurgitation is mild-moderate. 10. The aortic valve has an indeterminant number of cusps. Aortic valve regurgitation is not visualized. Moderate aortic valve sclerosis/calcification without any evidence of aortic stenosis. 11. The pulmonic valve was normal in structure. Pulmonic valve regurgitation is not visualized. 12. Mildly elevated pulmonary artery systolic pressure. 13. The inferior vena cava is normal in size with <50% respiratory variability, suggesting right atrial pressure of 8 mmHg. FINDINGS  Left Ventricle: Left ventricular ejection fraction, by visual estimation, is 60 to 65%. The left ventricle has normal function. The left ventricle has no regional wall motion abnormalities. There is no left ventricular hypertrophy. Left ventricular diastolic parameters are consistent with Grade I diastolic dysfunction (impaired relaxation). Elevated left ventricular  end-diastolic pressure. Right Ventricle: The right ventricular size is normal. No increase in right ventricular wall thickness. Global RV systolic function is has normal systolic function. The tricuspid regurgitant velocity is 2.63 m/s, and with an assumed right atrial pressure  of 8 mmHg, the estimated right ventricular systolic pressure is mildly elevated at 35.7  mmHg. Left Atrium: Left atrial size was normal in size. Right Atrium: Right atrial size was mild-moderately dilated Pericardium: There is no evidence of pericardial effusion. Mitral Valve: The mitral valve is normal in structure. Trivial mitral valve regurgitation. No evidence of mitral valve stenosis by observation. MV peak gradient, 4.9 mmHg. Tricuspid Valve: The tricuspid valve is normal in structure. Tricuspid valve regurgitation is mild-moderate. Aortic Valve: The aortic valve has an indeterminant number of cusps. Aortic valve regurgitation is trivial. Mild to moderate aortic valve sclerosis/calcification is present, without any evidence of aortic stenosis. Aortic valve mean gradient measures 3.0  mmHg. Aortic valve peak gradient measures 7.0 mmHg. Pulmonic Valve: The pulmonic valve was normal in structure. Pulmonic valve regurgitation is not visualized. Pulmonic regurgitation is not visualized. Aorta: The aortic root, ascending aorta and aortic arch are all structurally normal, with no evidence of dilitation or obstruction. Venous: The inferior vena cava is normal in size with less than 50% respiratory variability, suggesting right atrial pressure of 8 mmHg. IAS/Shunts: No atrial level shunt detected by color flow Doppler. There is no evidence of a patent foramen ovale. No ventricular septal defect is seen or detected. There is no evidence of an atrial septal defect.   Diastology LV e' lateral:   5.15 cm/s LV E/e' lateral: 12.6 LV e' medial:    2.61 cm/s LV E/e' medial:  24.9  RIGHT VENTRICLE             IVC RV Basal diam:  2.80 cm     IVC diam: 1.60 cm RV S prime:     13.10 cm/s TAPSE (M-mode): 2.2 cm LEFT ATRIUM             Index       RIGHT ATRIUM           Index LA Vol (A2C):   35.4 ml 25.70 ml/m RA Area:     20.50 cm LA Vol (A4C):   39.4 ml 28.61 ml/m RA Volume:   65.70 ml  47.71 ml/m LA Biplane Vol: 38.3 ml 27.81 ml/m  AORTIC VALVE AV Vmax:           132.00 cm/s AV Vmean:          85.000 cm/s AV  VTI:            0.291 m AV Peak Grad:      7.0 mmHg AV Mean Grad:      3.0 mmHg LVOT Vmax:         107.00 cm/s LVOT Vmean:        63.600 cm/s LVOT VTI:          0.212 m LVOT/AV VTI ratio: 0.73 MITRAL VALVE                        TRICUSPID VALVE MV Area (PHT): 2.66 cm             TR Peak grad:   27.7 mmHg MV Peak grad:  4.9 mmHg             TR Vmax:        281.00 cm/s MV Mean grad:  1.0 mmHg MV Vmax:       1.11 m/s             SHUNTS MV Vmean:      49.3 cm/s            Systemic VTI: 0.21 m MV VTI:        0.38 m MV PHT:        82.65 msec MV Decel Time: 285 msec MV E velocity: 65.10 cm/s 103 cm/s MV A velocity: 93.40 cm/s 70.3 cm/s MV E/A ratio:  0.70       1.5  Armanda Magicraci Turner MD Electronically signed by Armanda Magicraci Turner MD Signature Date/Time: 04/29/2019/11:22:16 AM    Final       Procedures:   Subjective: Patient is awake but not interactive, global aphasia, no signs of dyspnea or pain.   Discharge Exam: Vitals:   04/30/19 2353 05/01/19 0801  BP: (!) 154/96 (!) 145/101  Pulse: 63 71  Resp: 18 20  Temp: (!) 97.5 F (36.4 C) 98.2 F (36.8 C)  SpO2: 100% 97%   Vitals:   04/30/19 0833 04/30/19 1204 04/30/19 2353 05/01/19 0801  BP: (!) 171/94 (!) 160/92 (!) 154/96 (!) 145/101  Pulse: (!) 51 (!) 57 63 71  Resp: 16 16 18 20   Temp: 97.8 F (36.6 C) 98.2 F (36.8 C) (!) 97.5 F (36.4 C) 98.2 F (36.8 C)  TempSrc: Axillary Axillary Oral Axillary  SpO2: 98% 98% 100% 97%  Weight:      Height:        General: deconditioned and ill looking appearing  Neurology: Awake, but not interactive, global aphasia.  E ENT: mild pallor, no icterus, oral mucosa moist Cardiovascular: No JVD. S1-S2 present, rhythmic, no gallops, rubs, or murmurs. No lower extremity edema. Pulmonary: positive breath sounds bilaterally, adequate air movement, no wheezing, rhonchi or rales. Gastrointestinal. Abdomen with no organomegaly, non tender, no rebound or guarding Skin. No rashes Musculoskeletal: no joint  deformities   The results of significant diagnostics from this hospitalization (including imaging, microbiology, ancillary and laboratory) are listed below for reference.     Microbiology: Recent Results (from the past 240 hour(s))  Urine culture     Status: Abnormal   Collection Time: 04/28/19  2:05 AM   Specimen: Urine, Catheterized  Result Value Ref Range Status   Specimen Description URINE, CATHETERIZED  Final   Special Requests   Final    NONE Performed at [facility], [address] [place], [place] [address]    Culture MULTIPLE SPECIES PRESENT, SUGGEST RECOLLECTION (A)  Final   Report Status 04/29/2019 FINAL  Final  SARS CORONAVIRUS 2 (TAT 6-24 HRS) Nasopharyngeal Nasopharyngeal Swab     Status: None   Collection Time: 04/28/19  6:32 AM   Specimen: Nasopharyngeal Swab  Result Value Ref Range Status   SARS Coronavirus 2 NEGATIVE NEGATIVE Final    Comment: (NOTE) SARS-CoV-2 target nucleic acids are NOT DETECTED. The SARS-CoV-2 RNA is generally detectable in upper and lower respiratory specimens during the acute phase of infection. Negative results do not preclude SARS-CoV-2 infection, do not rule out co-infections with other pathogens, and should not be used as the sole basis for treatment or other patient management decisions. Negative results must be combined with clinical observations, patient history, and epidemiological information. The expected result is Negative. Fact Sheet for Patients: HairSlick.nohttps://www.fda.gov/media/138098/download Fact Sheet for Healthcare Providers: quierodirigir.comhttps://www.fda.gov/media/138095/download This test is not yet approved or cleared by the [place] FDA and  has  been authorized for detection and/or diagnosis of SARS-CoV-2 by FDA under an Emergency Use Authorization (EUA). This EUA will remain  in effect (meaning this test can be used) for the duration of the COVID-19 declaration under Section 56 4(b)(1) of the Act, 21 U.S.C. section  360bbb-3(b)(1), unless the authorization is terminated or revoked sooner. Performed at [facility], [address], [place], [place] [address]      Labs: BNP (last 3 results) No results for input(s): BNP in the last 8760 hours. Basic Metabolic Panel: Recent Labs  Lab 04/28/19 0212 04/28/19 1140 04/29/19 0449  NA 144  --  139  K 3.0*  --  3.5  CL 115*  --  105  CO2 18*  --  24  GLUCOSE 110*  --  85  BUN 13  --  8  CREATININE 0.76  --  0.79  CALCIUM 7.7*  --  9.0  MG  --  1.5*  --    Liver Function Tests: Recent Labs  Lab 04/28/19 0212  AST 15  ALT 9  ALKPHOS 35*  BILITOT 0.7  PROT 5.6*  ALBUMIN 3.1*   No results for input(s): LIPASE, AMYLASE in the last 168 hours. No results for input(s): AMMONIA in the last 168 hours. CBC: Recent Labs  Lab 04/28/19 0212 04/29/19 0449  WBC 4.2 5.6  HGB 10.4* 11.0*  HCT 30.9* 31.9*  MCV 84.9 82.2  PLT 94* 91*   Cardiac Enzymes: No results for input(s): CKTOTAL, CKMB, CKMBINDEX, TROPONINI in the last 168 hours. BNP: Invalid input(s): POCBNP CBG: No results for input(s): GLUCAP in the last 168 hours. D-Dimer No results for input(s): DDIMER in the last 72 hours. Hgb A1c No results for input(s): HGBA1C in the last 72 hours. Lipid Profile No results for input(s): CHOL, HDL, LDLCALC, TRIG, CHOLHDL, LDLDIRECT in the last 72 hours. Thyroid function studies No results for input(s): TSH, T4TOTAL, T3FREE, THYROIDAB in the last 72 hours.  Invalid input(s): FREET3 Anemia work up No results for input(s): VITAMINB12, FOLATE, FERRITIN, TIBC, IRON, RETICCTPCT in the last 72 hours. Urinalysis    Component Value Date/Time   COLORURINE YELLOW 04/28/2019 0212   APPEARANCEUR HAZY (A) 04/28/2019 0212   LABSPEC 1.014 04/28/2019 0212   PHURINE 6.0 04/28/2019 0212   GLUCOSEU NEGATIVE 04/28/2019 0212   HGBUR SMALL (A) 04/28/2019 0212   BILIRUBINUR NEGATIVE 04/28/2019 0212   KETONESUR 20 (A) 04/28/2019 0212   PROTEINUR NEGATIVE  04/28/2019 0212   UROBILINOGEN 0.2 10/17/2014 2304   NITRITE NEGATIVE 04/28/2019 0212   LEUKOCYTESUR TRACE (A) 04/28/2019 0212   Sepsis Labs Invalid input(s): PROCALCITONIN,  WBC,  LACTICIDVEN Microbiology Recent Results (from the past 240 hour(s))  Urine culture     Status: Abnormal   Collection Time: 04/28/19  2:05 AM   Specimen: Urine, Catheterized  Result Value Ref Range Status   Specimen Description URINE, CATHETERIZED  Final   Special Requests   Final    NONE Performed at [facility], [address] [place], [place] [address]    Culture MULTIPLE SPECIES PRESENT, SUGGEST RECOLLECTION (A)  Final   Report Status 04/29/2019 FINAL  Final  SARS CORONAVIRUS 2 (TAT 6-24 HRS) Nasopharyngeal Nasopharyngeal Swab     Status: None   Collection Time: 04/28/19  6:32 AM   Specimen: Nasopharyngeal Swab  Result Value Ref Range Status   SARS Coronavirus 2 NEGATIVE NEGATIVE Final    Comment: (NOTE) SARS-CoV-2 target nucleic acids are NOT DETECTED. The SARS-CoV-2 RNA is  generally detectable in upper and lower respiratory specimens during the acute phase of infection. Negative results do not preclude SARS-CoV-2 infection, do not rule out co-infections with other pathogens, and should not be used as the sole basis for treatment or other patient management decisions. Negative results must be combined with clinical observations, patient history, and epidemiological information. The expected result is Negative. Fact Sheet for Patients: HairSlick.no Fact Sheet for Healthcare Providers: quierodirigir.com This test is not yet approved or cleared by the [place] FDA and  has been authorized for detection and/or diagnosis of SARS-CoV-2 by FDA under an Emergency Use Authorization (EUA). This EUA will remain  in effect (meaning this test can be used) for the duration of the COVID-19 declaration under Section 56 4(b)(1) of the Act, 21  U.S.C. section 360bbb-3(b)(1), unless the authorization is terminated or revoked sooner. Performed at [facility], [address], [place], [place] [address]      Time coordinating discharge: 45 minutes  SIGNED:   Coralie Keens, MD  [facility] 05/01/2019, 12:47 PM

## 2019-05-01 NOTE — TOC Progression Note (Signed)
Transition of Care [facility]) - Progression Note    Patient Details  Name: [name] MRN: [id number] Date of Birth: [birth year]  Transition of Care [facility]) CM/SW Contact  Terrilee Croak, Student-Social Work Phone Number: 05/01/2019, 12:17 PM  Clinical Narrative:     MSW Intern spoke with Chales Abrahams, the hospice liaison, to discuss Hospice services at [facility] Pt's sons are agreeable per palliative. Chales Abrahams will send information to [facility] and follow up with SW.       Expected Discharge Plan and Services                                                 Social Determinants of Health (SDOH) Interventions    Readmission Risk Interventions No flowsheet data found.

## 2019-05-02 DIAGNOSIS — F0390 Unspecified dementia without behavioral disturbance: Secondary | ICD-10-CM

## 2019-05-02 NOTE — Progress Notes (Signed)
[facility] - Civil engineer, contracting  Continue to follow for family interest in [place]. Unfortunately [facility] is not able to offer a room today. Attempted contact with son [name]. Left voice message requesting a call back. TOC manager Marisue Ivan updated. Hospital liaison will follow up tomorrow or sooner if a room becomes available.   Please do not hesitate to call with [facility] related questions.   Thank you,  Forrestine Him, LCSW [phone] [facility] liaisons are listed daily on AMION under [facility]

## 2019-05-02 NOTE — Progress Notes (Signed)
PROGRESS NOTE    [name]  TGG:[id number] DOB: Dec 26, 1929 DOA: 04/28/2019 PCP: Jani Gravel, MD   Brief Narrative: 86 year old female who presented after a fall. She does have significant past med history for dementia, hypertension, dyslipidemia, type 2 diabetes mellitus, nephrolithiasis and retained ureteral stone. Patient experienced a unwitnessed fall, she was found on the floor. About 24 hours prior to hospitalization, she was noted to be less reactive and responsive. On the initial physical examination blood pressure 170/93, heart rate 73, respiratory rate 16, temperature 98.3, oxygen saturation 90%, her lungs were clear to auscultation bilaterally, heart S1-S2 present, regular rate and rhythm, abdomen soft, no lower extremity edema,noted right sided hemiparesis.  Sodium 144, potassium 3.0 chloride 115 bicarb 18, glucose 110, white count 4.2, hemoglobin 10.4, hematocrit 30.9, platelets 94.  SARS COVID-19 negative.  Urine analysis specific gravity 1.015, 0-5 white cells, negative nitrates, 6-10 white cells.  Chest radiograph with no infiltrates.  Pelvic films with no acute osseous abnormality.  EKG 77 bpm, left axis deviation, left anterior fascicular block, sinus rhythm, no ST segment or T wave changes.  Head CT with positive atrophy, progressive since 2018, no acute findings.Brain MRI with multiple acute infarcts, most confluent in the anterior left MCA territory. Positivebilateral MCA/PCA watershed type ischemia.Underlying advanced bilateral temporal lobe atrophy.  Patient continue with poor responsiveness and persistent encephalopathy, palliative care team consulted and patient was changed to comfort care. Pending transfer to residential hospice.     Assessment & Plan:   Principal Problem:   Acute embolic stroke [facility]) Active Problems:   Acute encephalopathy   Dementia (HCC)   Uncontrolled hypertension   Abnormal urinalysis   DNR (do not resuscitate)   Palliative care  by specialist   Pain, generalized   1-Acute Multiples CVAs, anterior left MCA territory;  Patient with global aphasia with right hemiplegia.  Suspected metabolic phenomenon.  Patient with high risk for aspiration.  Patient has remained n.p.o.  Patient was evaluated by neurology who determined patient to have very poor prognosis.  Palliative care service was consulted and after discussion with family decision was made to transition to comfort care. Awaiting bed at residential hospice facility.  2-Dementia with acute ischemic encephalopathy: Patient with global aphasia poor prognosis.  Hypertension: Holding antihypertensive medication permissive hypertension. UTI was ruled out.     Estimated body mass index is 15.94 kg/m as calculated from the following:   Height as of this encounter: 5\' 3"  (1.6 m).   Weight as of this encounter: 40.8 kg.   DVT prophylaxis: on comfort.  Code Status: DNR Family Communication: no family at bedside.  Disposition Plan:  Patient is from: Home  Anticipated d/c date: discharge to residential hospice when bed available.  Barriers to d/c or necessity for inpatient status: awaiting bed at residential hospice facility   Consultants:   Neurology  Palliative  Procedures:     Antimicrobials:    Subjective: Non verbal, does not follow command. lethargic  Objective: Vitals:   04/30/19 2353 05/01/19 0801 05/01/19 2012 05/02/19 0826  BP: (!) 154/96 (!) 145/101 (!) 152/91 139/88  Pulse: 63 71 66 60  Resp: 18 20 15 15   Temp: (!) 97.5 F (36.4 C) 98.2 F (36.8 C) 98 F (36.7 C) 98.6 F (37 C)  TempSrc: Oral Axillary Oral Oral  SpO2: 100% 97% 98% 96%  Weight:      Height:       No intake or output data in the 24 hours ending 05/02/19 1617 Filed  Weights   04/28/19 0210  Weight: 40.8 kg    Examination:  General exam: Appears  comfortable , lethargic Respiratory system; bilateral ronchus Cardiovascular system: S1 & S2 heard,  RRR Gastrointestinal system: Abdomen is nondistended, soft and nontender.  Central nervous system: lethargic, not following command, open eyes to voice.  Extremities: no edema     Data Reviewed: I have personally reviewed following labs and imaging studies  CBC: Recent Labs  Lab 04/28/19 0212 04/29/19 0449  WBC 4.2 5.6  HGB 10.4* 11.0*  HCT 30.9* 31.9*  MCV 84.9 82.2  PLT 94* 91*   Basic Metabolic Panel: Recent Labs  Lab 04/28/19 0212 04/28/19 1140 04/29/19 0449  NA 144  --  139  K 3.0*  --  3.5  CL 115*  --  105  CO2 18*  --  24  GLUCOSE 110*  --  85  BUN 13  --  8  CREATININE 0.76  --  0.79  CALCIUM 7.7*  --  9.0  MG  --  1.5*  --    GFR: Estimated Creatinine Clearance: 31.9 mL/min (by C-G formula based on SCr of 0.79 mg/dL). Liver Function Tests: Recent Labs  Lab 04/28/19 0212  AST 15  ALT 9  ALKPHOS 35*  BILITOT 0.7  PROT 5.6*  ALBUMIN 3.1*   No results for input(s): LIPASE, AMYLASE in the last 168 hours. No results for input(s): AMMONIA in the last 168 hours. Coagulation Profile: No results for input(s): INR, PROTIME in the last 168 hours. Cardiac Enzymes: No results for input(s): CKTOTAL, CKMB, CKMBINDEX, TROPONINI in the last 168 hours. BNP (last 3 results) No results for input(s): PROBNP in the last 8760 hours. HbA1C: No results for input(s): HGBA1C in the last 72 hours. CBG: No results for input(s): GLUCAP in the last 168 hours. Lipid Profile: No results for input(s): CHOL, HDL, LDLCALC, TRIG, CHOLHDL, LDLDIRECT in the last 72 hours. Thyroid Function Tests: No results for input(s): TSH, T4TOTAL, FREET4, T3FREE, THYROIDAB in the last 72 hours. Anemia Panel: No results for input(s): VITAMINB12, FOLATE, FERRITIN, TIBC, IRON, RETICCTPCT in the last 72 hours. Sepsis Labs: No results for input(s): PROCALCITON, LATICACIDVEN in the last 168 hours.  Recent Results (from the past 240 hour(s))  Urine culture     Status: Abnormal   Collection Time:  04/28/19  2:05 AM   Specimen: Urine, Catheterized  Result Value Ref Range Status   Specimen Description URINE, CATHETERIZED  Final   Special Requests   Final    NONE Performed at [facility], [address], [place], [place] [address]    Culture MULTIPLE SPECIES PRESENT, SUGGEST RECOLLECTION (A)  Final   Report Status 04/29/2019 FINAL  Final  SARS CORONAVIRUS 2 (TAT 6-24 HRS) Nasopharyngeal Nasopharyngeal Swab     Status: None   Collection Time: 04/28/19  6:32 AM   Specimen: Nasopharyngeal Swab  Result Value Ref Range Status   SARS Coronavirus 2 NEGATIVE NEGATIVE Final    Comment: (NOTE) SARS-CoV-2 target nucleic acids are NOT DETECTED. The SARS-CoV-2 RNA is generally detectable in upper and lower respiratory specimens during the acute phase of infection. Negative results do not preclude SARS-CoV-2 infection, do not rule out co-infections with other pathogens, and should not be used as the sole basis for treatment or other patient management decisions. Negative results must be combined with clinical observations, patient history, and epidemiological information. The expected result is Negative. Fact Sheet for Patients: HairSlick.no Fact Sheet for Healthcare Providers: quierodirigir.com This  test is not yet approved or cleared by the [place] and  has been authorized for detection and/or diagnosis of SARS-CoV-2 by FDA under an Emergency Use Authorization (EUA). This EUA will remain  in effect (meaning this test can be used) for the duration of the COVID-19 declaration under Section 56 4(b)(1) of the Act, 21 U.S.C. section 360bbb-3(b)(1), unless the authorization is terminated or revoked sooner. Performed at [facility], [address], [place], [place] [address]          Radiology Studies: No results found.      Scheduled Meds: . morphine CONCENTRATE  5 mg Oral Q4H  . sodium chloride flush   3 mL Intravenous Q12H   Continuous Infusions:   LOS: 4 days    Time spent: 20 minutes    Tylia Ewell A Nickia Boesen, MD [facility]   If 7PM-7AM, please contact night-coverage [email or web address]  05/02/2019, 4:17 PM

## 2019-05-03 NOTE — Progress Notes (Signed)
PROGRESS NOTE    [name]  VCB:[id number] DOB: [birth year] DOA: 04/28/2019 PCP: Jani Gravel, MD   Brief Narrative: 86 year old female who presented after a fall. She does have significant past med history for dementia, hypertension, dyslipidemia, type 2 diabetes mellitus, nephrolithiasis and retained ureteral stone. Patient experienced a unwitnessed fall, she was found on the floor. About 24 hours prior to hospitalization, she was noted to be less reactive and responsive. On the initial physical examination blood pressure 170/93, heart rate 73, respiratory rate 16, temperature 98.3, oxygen saturation 90%, her lungs were clear to auscultation bilaterally, heart S1-S2 present, regular rate and rhythm, abdomen soft, no lower extremity edema,noted right sided hemiparesis.  Sodium 144, potassium 3.0 chloride 115 bicarb 18, glucose 110, white count 4.2, hemoglobin 10.4, hematocrit 30.9, platelets 94.  SARS COVID-19 negative.  Urine analysis specific gravity 1.015, 0-5 white cells, negative nitrates, 6-10 white cells.  Chest radiograph with no infiltrates.  Pelvic films with no acute osseous abnormality.  EKG 77 bpm, left axis deviation, left anterior fascicular block, sinus rhythm, no ST segment or T wave changes.  Head CT with positive atrophy, progressive since 2018, no acute findings.Brain MRI with multiple acute infarcts, most confluent in the anterior left MCA territory. Positivebilateral MCA/PCA watershed type ischemia.Underlying advanced bilateral temporal lobe atrophy.  Patient continue with poor responsiveness and persistent encephalopathy, palliative care team consulted and patient was changed to comfort care. Pending transfer to residential hospice.     Assessment & Plan:   Principal Problem:   Acute embolic stroke [facility]) Active Problems:   Acute encephalopathy   Dementia (HCC)   Uncontrolled hypertension   Abnormal urinalysis   DNR (do not resuscitate)   Palliative care  by specialist   Pain, generalized   1-Acute Multiples CVAs, anterior left MCA territory;  Patient with global aphasia with right hemiplegia.  Suspected metabolic phenomenon.  Patient with high risk for aspiration.  Patient has remained n.p.o.  Patient was evaluated by neurology who determined patient to have very poor prognosis.  Palliative care service was consulted and after discussion with family decision was made to transition to comfort care. Awaiting bed at residential hospice facility.  2-Dementia with acute ischemic encephalopathy: Patient with global aphasia poor prognosis.  Hypertension: Holding antihypertensive medication permissive hypertension. UTI was ruled out.     Estimated body mass index is 15.94 kg/m as calculated from the following:   Height as of this encounter: 5\' 3"  (1.6 m).   Weight as of this encounter: 40.8 kg.   DVT prophylaxis: on comfort.  Code Status: DNR Family Communication: no family at bedside.  Disposition Plan:  Patient is from: Home  Anticipated d/c date: discharge to residential hospice when bed available.  Barriers to d/c or necessity for inpatient status: awaiting bed at residential hospice facility   Consultants:   Neurology  Palliative  Procedures:     Antimicrobials:    Subjective: Non verbal, eyes open.   Objective: Vitals:   05/01/19 2012 05/02/19 0826 05/02/19 2053 05/03/19 0859  BP: (!) 152/91 139/88 125/86 122/87  Pulse: 66 60 69 64  Resp: 15 15 16 20   Temp: 98 F (36.7 C) 98.6 F (37 C) 98.2 F (36.8 C) 97.8 F (36.6 C)  TempSrc: Oral Oral Oral Oral  SpO2: 98% 96% 97% 96%  Weight:      Height:       No intake or output data in the 24 hours ending 05/03/19 1424 Filed Weights   04/28/19 0210  Weight: 40.8 kg    Examination:  General exam: Lethargic.  Respiratory system; Bilateral [place] Cardiovascular system: S 1, S 2 RRR Gastrointestinal system: BS present, soft, nt  Central nervous system:  Lethargic, eyes open, does not follows command Extremities: no edema    Data Reviewed: I have personally reviewed following labs and imaging studies  CBC: Recent Labs  Lab 04/28/19 0212 04/29/19 0449  WBC 4.2 5.6  HGB 10.4* 11.0*  HCT 30.9* 31.9*  MCV 84.9 82.2  PLT 94* 91*   Basic Metabolic Panel: Recent Labs  Lab 04/28/19 0212 04/28/19 1140 04/29/19 0449  NA 144  --  139  K 3.0*  --  3.5  CL 115*  --  105  CO2 18*  --  24  GLUCOSE 110*  --  85  BUN 13  --  8  CREATININE 0.76  --  0.79  CALCIUM 7.7*  --  9.0  MG  --  1.5*  --    GFR: Estimated Creatinine Clearance: 31.9 mL/min (by C-G formula based on SCr of 0.79 mg/dL). Liver Function Tests: Recent Labs  Lab 04/28/19 0212  AST 15  ALT 9  ALKPHOS 35*  BILITOT 0.7  PROT 5.6*  ALBUMIN 3.1*   No results for input(s): LIPASE, AMYLASE in the last 168 hours. No results for input(s): AMMONIA in the last 168 hours. Coagulation Profile: No results for input(s): INR, PROTIME in the last 168 hours. Cardiac Enzymes: No results for input(s): CKTOTAL, CKMB, CKMBINDEX, TROPONINI in the last 168 hours. BNP (last 3 results) No results for input(s): PROBNP in the last 8760 hours. HbA1C: No results for input(s): HGBA1C in the last 72 hours. CBG: No results for input(s): GLUCAP in the last 168 hours. Lipid Profile: No results for input(s): CHOL, HDL, LDLCALC, TRIG, CHOLHDL, LDLDIRECT in the last 72 hours. Thyroid Function Tests: No results for input(s): TSH, T4TOTAL, FREET4, T3FREE, THYROIDAB in the last 72 hours. Anemia Panel: No results for input(s): VITAMINB12, FOLATE, FERRITIN, TIBC, IRON, RETICCTPCT in the last 72 hours. Sepsis Labs: No results for input(s): PROCALCITON, LATICACIDVEN in the last 168 hours.  Recent Results (from the past 240 hour(s))  Urine culture     Status: Abnormal   Collection Time: 04/28/19  2:05 AM   Specimen: Urine, Catheterized  Result Value Ref Range Status   Specimen Description  URINE, CATHETERIZED  Final   Special Requests   Final    NONE Performed at [facility], [address], [place], [place] [address]    Culture MULTIPLE SPECIES PRESENT, SUGGEST RECOLLECTION (A)  Final   Report Status 04/29/2019 FINAL  Final  SARS CORONAVIRUS 2 (TAT 6-24 HRS) Nasopharyngeal Nasopharyngeal Swab     Status: None   Collection Time: 04/28/19  6:32 AM   Specimen: Nasopharyngeal Swab  Result Value Ref Range Status   SARS Coronavirus 2 NEGATIVE NEGATIVE Final    Comment: (NOTE) SARS-CoV-2 target nucleic acids are NOT DETECTED. The SARS-CoV-2 RNA is generally detectable in upper and lower respiratory specimens during the acute phase of infection. Negative results do not preclude SARS-CoV-2 infection, do not rule out co-infections with other pathogens, and should not be used as the sole basis for treatment or other patient management decisions. Negative results must be combined with clinical observations, patient history, and epidemiological information. The expected result is Negative. Fact Sheet for Patients: HairSlick.no Fact Sheet for Healthcare Providers: quierodirigir.com This test is not yet approved or cleared by the [place] FDA and  has been authorized for detection and/or diagnosis of SARS-CoV-2 by FDA under an Emergency Use Authorization (EUA). This EUA will remain  in effect (meaning this test can be used) for the duration of the COVID-19 declaration under Section 56 4(b)(1) of the Act, 21 U.S.C. section 360bbb-3(b)(1), unless the authorization is terminated or revoked sooner. Performed at [facility], [address], [place], [place] [address]          Radiology Studies: No results found.      Scheduled Meds: . morphine CONCENTRATE  5 mg Oral Q4H  . sodium chloride flush  3 mL Intravenous Q12H   Continuous Infusions:   LOS: 5 days    Time spent: 20  minutes    Keyasia Jolliff A Derreon Consalvo, MD [facility]   If 7PM-7AM, please contact night-coverage [email or web address]  05/03/2019, 2:24 PM

## 2019-05-03 NOTE — Progress Notes (Signed)
[facility] - [employer] engineer, contracting  Continue to follow for family interest in [facility]. Unfortunately [facility] is not able to offer a room today. TOC manager Jess updated. Hospital liaison will follow up tomorrow or sooner if a room becomes available.   Please do not hesitate to call with [facility] related questions.   Thank you,  Elsie Saas, RN, CCM [phone] [facility] liaisons are listed daily on AMION under [facility] [place]

## 2019-05-04 NOTE — Care Management Important Message (Signed)
Important Message  Patient Details  Name: [name] MRN: [id number] Date of Birth: [birth year]   Medicare Important Message Given:  Yes     Dorena Bodo 05/04/2019, 1:44 PM

## 2019-05-04 NOTE — Discharge Summary (Addendum)
Physician Discharge Summary  [name] JXB:[id number] DOB: Aug 07, 1929 DOA: 04/28/2019  PCP: Pearson Grippe, MD  Admit date: 04/28/2019 Discharge date: 05/04/2019  Admitted From: Home  Disposition:  Residential Hospice.   Recommendations for Outpatient Follow-up:  1. Comfort care.    Discharge Condition: Guarded CODE STATUS: DNR Diet recommendation: comfort feeding  Brief/Interim Summary: 85 year old female who presented after a fall. She does have significant past med history for dementia, hypertension, dyslipidemia, type 2 diabetes mellitus,nephrolithiasis and retained ureteral stone. Patient experienced a unwitnessed fall,shewas found on the floor. About 24 hours prior to hospitalization, shewas noted to be less reactive and responsive. On the initial physical examination blood pressure 170/93, heart rate 73, respiratory rate16, temperature 98.3, oxygen saturation 90%, her lungs were clear to auscultation bilaterally, heart S1-S2 present, regular rate and rhythm,abdomen soft, nolower extremity edema,noted right sided hemiparesis.Sodium 144, potassium 3.0 chloride 115 bicarb 18, glucose 110, white count 4.2, hemoglobin 10.4, hematocrit 30.9, platelets 94.SARS COVID-19 negative. Urine analysis specific gravity 1.015, 0-5 white cells, negative nitrates, 6-10 white cells. Chest radiograph with no infiltrates. Pelvic films with no acute osseous abnormality. EKG 77 bpm, left axis deviation, left anterior fascicular block, sinus rhythm, no ST segment or T wave changes.  Head CT with positive atrophy, progressive since 2018, no acute findings.Brain MRI with multiple acute infarcts, most confluent in the anterior left MCA territory. Positivebilateral MCA/PCA watershed type ischemia.Underlying advanced bilateral temporal lobe atrophy.  Patient continue with poor responsivenessand persistent encephalopathy, palliative care team consulted and patient waschanged to comfort  care.Pending transfer to residential hospice.   1-Acute Multiples CVAs, anterior left MCA territory;  Patient with global aphasia with right hemiplegia.  Suspected metabolic phenomenon.  Patient with high risk for aspiration.  Patient has remained n.p.o.  Patient was evaluated by neurology who determined patient to have very poor prognosis.  Palliative care service was consulted and after discussion with family decision was made to transition to comfort care. Bed available today at residential hospice facility. Patient will be transfer today  No statins at discharge, because patient is comfort care.   2-Dementia with acute ischemic encephalopathy: Patient with global aphasia poor prognosis.  Hypertension: Holding antihypertensive medication permissive hypertension.  UTI was ruled out.  Stable to be transfer to [facility] place.   Discharge Diagnoses:  Principal Problem:   Acute embolic stroke [facility]) Active Problems:   Acute encephalopathy   Dementia (HCC)   Uncontrolled hypertension   Abnormal urinalysis   DNR (do not resuscitate)   Palliative care by specialist   Pain, generalized    Discharge Instructions  Discharge Instructions    Diet - low sodium heart healthy   Complete by: As directed    Increase activity slowly   Complete by: As directed      Allergies as of 05/04/2019      Reactions   Lisinopril Anaphylaxis   Bactrim [sulfamethoxazole-trimethoprim] Other (See Comments)   AKI   Codeine Nausea And Vomiting   Keflex [cephalexin] Swelling   Angioedema      Medication List    STOP taking these medications   bismuth subsalicylate 262 MG/15ML suspension Commonly known as: PEPTO BISMOL   docusate sodium 100 MG capsule Commonly known as: COLACE   hydrALAZINE 25 MG tablet Commonly known as: APRESOLINE   hydrochlorothiazide 12.5 MG tablet Commonly known as: HYDRODIURIL   ibuprofen 200 MG tablet Commonly known as: ADVIL   polyethylene glycol 17 g  packet Commonly known as: MIRALAX / GLYCOLAX  Allergies  Allergen Reactions  . Lisinopril Anaphylaxis  . Bactrim [Sulfamethoxazole-Trimethoprim] Other (See Comments)    AKI  . Codeine Nausea And Vomiting  . Keflex [Cephalexin] Swelling    Angioedema    Consultations:  Neurology  Palliative   Procedures/Studies: CT ANGIO HEAD W OR WO CONTRAST  Addendum Date: 04/28/2019   ADDENDUM REPORT: 04/28/2019 12:14 ADDENDUM: Study discussed by telephone with Dr. Fuller Plan with the hospitalist service on 04/28/2019 at 1211 hours. Electronically Signed   By: Genevie Ann M.D.   On: 04/28/2019 12:14   Result Date: 04/28/2019 CLINICAL DATA:  86 year old found down with mental status changes. Acute left MCA and also bilateral MCA/PCA watershed area infarcts on brain MRI today. EXAM: CT ANGIOGRAPHY HEAD AND NECK TECHNIQUE: Multidetector CT imaging of the head and neck was performed using the standard protocol during bolus administration of intravenous contrast. Multiplanar CT image reconstructions and MIPs were obtained to evaluate the vascular anatomy. Carotid stenosis measurements (when applicable) are obtained utilizing NASCET criteria, using the distal internal carotid diameter as the denominator. CONTRAST:  26mL OMNIPAQUE IOHEXOL 350 MG/ML SOLN COMPARISON:  Brain MRI and head CT. Earlier today. FINDINGS: CTA NECK Skeleton: Absent dentition. No acute osseous abnormality identified. Upper chest: Small volume retained secretions in the trachea on series 5, image 146. Visible upper lungs are clear. Negative visible superior mediastinum. Other neck: Negative. No acute findings. Aortic arch: Good contrast timing. Four vessel arch configuration, the left vertebral artery arises directly from the arch. Ectatic arch with minimal atherosclerosis. Right carotid system: Negative brachiocephalic artery and right CCA origin aside from mild tortuosity. Tortuous proximal right CCA with a kinked appearance at the  thoracic inlet and retropharyngeal course. Capacious right carotid bifurcation. Partially retropharyngeal cervical right ICA without plaque or stenosis. Left carotid system: Negative left CCA origin. Mildly tortuous left CCA. Capacious and partially retropharyngeal left carotid bifurcation. Retropharyngeal cervical left ICA is tortuous without plaque or stenosis. Vertebral arteries: Ectatic proximal right subclavian artery with minimal plaque and no stenosis. Capacious right vertebral artery origin. The right V1 segment tapers, and the vessel has a very late entry into the cervical transverse foramen, diminutive up until the the more distal V2 segment. However, the vessel is occluded in the V3 segment and/or terminates in upper cervical muscular branches. The left vertebral artery arises directly from the arch with no plaque or stenosis. The left vertebral has a fairly normal caliber and is patent to the skull base without plaque or stenosis. CTA HEAD Posterior circulation: The left vertebral artery supplies the basilar with severe stenosis in the distal V4 segment on series 7, image 20 and series 11, image 23. The right AICA is patent and probably dominant. There is no distal right vertebral artery enhancement. Patent basilar artery with moderate mid basilar irregularity and stenosis. The distal basilar is also tapered with moderate stenosis (series 11, image 22). But the tip remains patent with fetal type bilateral PCA origins. The right posterior communicating artery and proximal right PCA are within normal limits. There is a mild to moderate right P2 segment stenosis on series 12, image 14. The left posterior communicating artery and left PCA are highly atherosclerotic and demonstrates severe tandem stenoses through the P2 and proximal P3 segments (series 12, image 18). There is distal PCA enhancement. Anterior circulation: Both ICA siphons are patent and ectatic without plaque or stenosis. Normal ophthalmic and  posterior communicating artery origins. Patent carotid termini. Normal MCA and left ACA origins. Left A1 is dominant  and the right diminutive. Anterior communicating artery and bilateral ACA branches are within normal limits. Right MCA M1 segment, bifurcation, and proximal right MCA branches are within normal limits. There is mild distal right MCA branch irregularity and stenosis. The left MCA M1 segment is occluded about 18 mm distal to its origin near the left MCA bifurcation. There are some reconstituted left MCA branches but they are diminutive (series 12, image 23). Venous sinuses: Early contrast timing but grossly patent. Anatomic variants: Left vertebral artery arises directly from the arch and supplies the basilar due to either distal right vertebral artery occlusion or terminating outside the skull in muscular branches. Fetal type bilateral PCA origins. Dominant left and diminutive right ACA A1 segments. Review of the MIP images confirms the above findings IMPRESSION: 1. Positive for Emergent Large Vessel Occlusion: Left MCA distal M1 segment. Some reconstituted Left MCA branches, but they are diminutive. 2. Positive also for superimposed intracranial atherosclerosis, including tandem severe Left and moderate Right PCA stenoses. There are also severe stenoses of the Left Vertebral Artery V4 (sole supply to the Basilar) and moderate Basilar Artery stenoses. 3. Additionally, the right vertebral artery either functionally terminates outside the skull in muscular branches or is occluded distally. 4. Carotid artery ectasia without plaque or stenosis. Ectatic aortic arch without significant plaque. 5. Small volume retained secretions in the trachea. Electronically Signed: By: Odessa Fleming M.D. On: 04/28/2019 12:05   DG Chest 1 View  Result Date: 04/28/2019 CLINICAL DATA:  Fall EXAM: CHEST  1 VIEW COMPARISON:  None. FINDINGS: There is mild cardiomegaly. Aortic knob calcifications. There is slight hyperinflation of  the upper lung zones. No focal airspace consolidation or pleural effusion. No acute osseous abnormality. IMPRESSION: No active disease. Electronically Signed   By: Jonna Clark M.D.   On: 04/28/2019 03:34   DG Pelvis 1-2 Views  Result Date: 04/28/2019 CLINICAL DATA:  Fall EXAM: PELVIS - 1-2 VIEW COMPARISON:  None. FINDINGS: There is no evidence of pelvic fracture or diastasis. There is diffuse osteopenia. Overlying bowel gas and stool seen within the deep pelvis. Degenerative changes in the lower lumbar spine. IMPRESSION: No definite acute osseous abnormality. Electronically Signed   By: Jonna Clark M.D.   On: 04/28/2019 03:33   CT HEAD WO CONTRAST  Result Date: 04/29/2019 CLINICAL DATA:  Stroke follow-up. Left brain infarctions diagnosed yesterday. EXAM: CT HEAD WITHOUT CONTRAST TECHNIQUE: Contiguous axial images were obtained from the base of the skull through the vertex without intravenous contrast. COMPARISON:  CT and MR studies 04/28/2019 FINDINGS: Brain: Developing low-density in the region of infarction primarily in the left hemisphere and most prominently in the left frontal operculum ir region and frontoparietal cortical and subcortical brain. Early mild swelling but no mass effect. No hemorrhagic transformation. Slightly more prominent low-density in the deep white matter of the right parietal lobe in the area of smaller acute infarctions. No new area of brain involvement is seen. No hydrocephalus. No shift. Vascular: No vascular finding by CT. Skull: Negative Sinuses/Orbits: Clear/normal.  Previous lens implants. Other: None IMPRESSION: Developing low-density in the areas of acute infarction more extensively in the left MCA territory than the right. No evidence of hemorrhage or significant mass effect/shift. Electronically Signed   By: Paulina Fusi M.D.   On: 04/29/2019 00:20   CT Head Wo Contrast  Result Date: 04/28/2019 CLINICAL DATA:  Found on the floor tonight.  Mental status changes. EXAM:  CT HEAD WITHOUT CONTRAST TECHNIQUE: Contiguous axial images were obtained from  the base of the skull through the vertex without intravenous contrast. COMPARISON:  03/15/2017 FINDINGS: Brain: Pronounced generalized brain atrophy, progressive since 2018. There is temporal lobe predominance. Chronic small-vessel ischemic changes affect the hemispheric white matter. No sign of acute infarction, mass lesion, hemorrhage, hydrocephalus or extra-axial collection. Vascular: There is atherosclerotic calcification of the major vessels at the base of the brain. Skull: No skull fracture. Sinuses/Orbits: Clear/normal Other: None IMPRESSION: No acute or traumatic finding. Brain atrophy, progressive since 2018. Temporal lobe predominance, as can be seen with Alzheimer's dementia. Electronically Signed   By: Paulina Fusi M.D.   On: 04/28/2019 03:43   CT ANGIO NECK W OR WO CONTRAST  Addendum Date: 04/28/2019   ADDENDUM REPORT: 04/28/2019 12:14 ADDENDUM: Study discussed by telephone with Dr. Madelyn Flavors with the hospitalist service on 04/28/2019 at 1211 hours. Electronically Signed   By: Odessa Fleming M.D.   On: 04/28/2019 12:14   Result Date: 04/28/2019 CLINICAL DATA:  86 year old found down with mental status changes. Acute left MCA and also bilateral MCA/PCA watershed area infarcts on brain MRI today. EXAM: CT ANGIOGRAPHY HEAD AND NECK TECHNIQUE: Multidetector CT imaging of the head and neck was performed using the standard protocol during bolus administration of intravenous contrast. Multiplanar CT image reconstructions and MIPs were obtained to evaluate the vascular anatomy. Carotid stenosis measurements (when applicable) are obtained utilizing NASCET criteria, using the distal internal carotid diameter as the denominator. CONTRAST:  50mL OMNIPAQUE IOHEXOL 350 MG/ML SOLN COMPARISON:  Brain MRI and head CT. Earlier today. FINDINGS: CTA NECK Skeleton: Absent dentition. No acute osseous abnormality identified. Upper chest: Small  volume retained secretions in the trachea on series 5, image 146. Visible upper lungs are clear. Negative visible superior mediastinum. Other neck: Negative. No acute findings. Aortic arch: Good contrast timing. Four vessel arch configuration, the left vertebral artery arises directly from the arch. Ectatic arch with minimal atherosclerosis. Right carotid system: Negative brachiocephalic artery and right CCA origin aside from mild tortuosity. Tortuous proximal right CCA with a kinked appearance at the thoracic inlet and retropharyngeal course. Capacious right carotid bifurcation. Partially retropharyngeal cervical right ICA without plaque or stenosis. Left carotid system: Negative left CCA origin. Mildly tortuous left CCA. Capacious and partially retropharyngeal left carotid bifurcation. Retropharyngeal cervical left ICA is tortuous without plaque or stenosis. Vertebral arteries: Ectatic proximal right subclavian artery with minimal plaque and no stenosis. Capacious right vertebral artery origin. The right V1 segment tapers, and the vessel has a very late entry into the cervical transverse foramen, diminutive up until the the more distal V2 segment. However, the vessel is occluded in the V3 segment and/or terminates in upper cervical muscular branches. The left vertebral artery arises directly from the arch with no plaque or stenosis. The left vertebral has a fairly normal caliber and is patent to the skull base without plaque or stenosis. CTA HEAD Posterior circulation: The left vertebral artery supplies the basilar with severe stenosis in the distal V4 segment on series 7, image 20 and series 11, image 23. The right AICA is patent and probably dominant. There is no distal right vertebral artery enhancement. Patent basilar artery with moderate mid basilar irregularity and stenosis. The distal basilar is also tapered with moderate stenosis (series 11, image 22). But the tip remains patent with fetal type bilateral  PCA origins. The right posterior communicating artery and proximal right PCA are within normal limits. There is a mild to moderate right P2 segment stenosis on series 12, image 14. The  left posterior communicating artery and left PCA are highly atherosclerotic and demonstrates severe tandem stenoses through the P2 and proximal P3 segments (series 12, image 18). There is distal PCA enhancement. Anterior circulation: Both ICA siphons are patent and ectatic without plaque or stenosis. Normal ophthalmic and posterior communicating artery origins. Patent carotid termini. Normal MCA and left ACA origins. Left A1 is dominant and the right diminutive. Anterior communicating artery and bilateral ACA branches are within normal limits. Right MCA M1 segment, bifurcation, and proximal right MCA branches are within normal limits. There is mild distal right MCA branch irregularity and stenosis. The left MCA M1 segment is occluded about 18 mm distal to its origin near the left MCA bifurcation. There are some reconstituted left MCA branches but they are diminutive (series 12, image 23). Venous sinuses: Early contrast timing but grossly patent. Anatomic variants: Left vertebral artery arises directly from the arch and supplies the basilar due to either distal right vertebral artery occlusion or terminating outside the skull in muscular branches. Fetal type bilateral PCA origins. Dominant left and diminutive right ACA A1 segments. Review of the MIP images confirms the above findings IMPRESSION: 1. Positive for Emergent Large Vessel Occlusion: Left MCA distal M1 segment. Some reconstituted Left MCA branches, but they are diminutive. 2. Positive also for superimposed intracranial atherosclerosis, including tandem severe Left and moderate Right PCA stenoses. There are also severe stenoses of the Left Vertebral Artery V4 (sole supply to the Basilar) and moderate Basilar Artery stenoses. 3. Additionally, the right vertebral artery either  functionally terminates outside the skull in muscular branches or is occluded distally. 4. Carotid artery ectasia without plaque or stenosis. Ectatic aortic arch without significant plaque. 5. Small volume retained secretions in the trachea. Electronically Signed: By: Odessa Fleming M.D. On: 04/28/2019 12:05   MR BRAIN WO CONTRAST  Result Date: 04/28/2019 CLINICAL DATA:  86 year old female found down with mental status changes. EXAM: MRI HEAD WITHOUT CONTRAST TECHNIQUE: Multiplanar, multiecho pulse sequences of the brain and surrounding structures were obtained without intravenous contrast. COMPARISON:  Head CT 0331 hours today. FINDINGS: Brain: Patchy and confluent restricted diffusion in the left hemisphere is most pronounced in the anterior MCA territory (series 7, image 53) including confluent involvement of the operculum and anterior left insula. However, there is also multifocal patchy left PCA and PCA/MCA watershed area ischemia with infarcts (left occipital lobe series 7, image 50). There are also mild contralateral right posterior MCA/PCA watershed area white matter infarcts (series 7, image 51). Sparing of the deep gray nuclei, brainstem and cerebellum. Associated T2 and FLAIR hyperintensity in the acutely affected areas with no mass effect or definite acute hemorrhage. There are occasional chronic micro hemorrhages including in the right periatrial white matter on series 12, image 14. There is patchy additional bilateral white matter T2 and FLAIR hyperintensity. There is chronic atrophy and/or encephalomalacia in both anterior temporal lobes with pronounced mesial temporal volume loss (series 11, image 10 and series 14, image 14). But no other cortical encephalomalacia. Ex vacuo ventricular enlargement. No midline shift, mass effect, evidence of mass lesion, extra-axial collection or acute intracranial hemorrhage. Cervicomedullary junction and pituitary are within normal limits. Vascular: Major intracranial  vascular flow voids are preserved. Skull and upper cervical spine: Negative visible cervical spine. Normal bone marrow signal. Sinuses/Orbits: Postoperative changes to the globes. Otherwise grossly negative orbits and paranasal sinuses. Other: Mastoids are clear. Scalp and face soft tissues appear negative. IMPRESSION: 1. Positive for multiple acute infarcts, most confluent in the  anterior Left MCA territory. But there is bilateral MCA/PCA watershed type ischemia greater on the left. Given sparing of the posterior fossa and deep gray nuclei this might reflect sequelae of hypertension superimposed on intracranial atherosclerosis rather than an embolic event. 2. No associated acute hemorrhage or mass effect. 3. Underlying advanced bilateral temporal lobe atrophy. Electronically Signed   By: Odessa Fleming M.D.   On: 04/28/2019 10:18   ECHOCARDIOGRAM COMPLETE  Result Date: 04/29/2019   ECHOCARDIOGRAM REPORT   Patient Name:   [name] Date of Exam: 04/29/2019 Medical Rec #:  [id number]     Height:       63.0 in Accession #:    [id number]    Weight:       90.0 lb Date of Birth:  [birth year]     BSA:          1.38 m Patient Age:    89 years      BP:           175/88 mmHg Patient Gender: F             HR:           52 bpm. Exam Location:  Inpatient Procedure: 2D Echo Indications:    Stroke  History:        Patient has no prior history of Echocardiogram examinations.                 Risk Factors:Hypertension, Diabetes and Dyslipidemia.  Sonographer:    Ross Ludwig RDCS (AE) Referring Phys: [id number] [facility] A SM[facility]  Sonographer Comments: No parasternal window and Technically difficult study due to poor echo windows. IMPRESSIONS  1. Left ventricular ejection fraction, by visual estimation, is 60 to 65%. The left ventricle has normal function. There is no left ventricular hypertrophy.  2. Elevated left ventricular end-diastolic pressure.  3. Left ventricular diastolic parameters are consistent with Grade I diastolic dysfunction  (impaired relaxation).  4. The left ventricle has no regional wall motion abnormalities.  5. Global right ventricle has normal systolic function.The right ventricular size is normal. No increase in right ventricular wall thickness.  6. Left atrial size was normal.  7. Right atrial size was mild-moderately dilated.  8. The mitral valve is normal in structure. Trivial mitral valve regurgitation. No evidence of mitral stenosis.  9. The tricuspid valve is normal in structure. Tricuspid valve regurgitation is mild-moderate. 10. The aortic valve has an indeterminant number of cusps. Aortic valve regurgitation is not visualized. Moderate aortic valve sclerosis/calcification without any evidence of aortic stenosis. 11. The pulmonic valve was normal in structure. Pulmonic valve regurgitation is not visualized. 12. Mildly elevated pulmonary artery systolic pressure. 13. The inferior vena cava is normal in size with <50% respiratory variability, suggesting right atrial pressure of 8 mmHg. FINDINGS  Left Ventricle: Left ventricular ejection fraction, by visual estimation, is 60 to 65%. The left ventricle has normal function. The left ventricle has no regional wall motion abnormalities. There is no left ventricular hypertrophy. Left ventricular diastolic parameters are consistent with Grade I diastolic dysfunction (impaired relaxation). Elevated left ventricular end-diastolic pressure. Right Ventricle: The right ventricular size is normal. No increase in right ventricular wall thickness. Global RV systolic function is has normal systolic function. The tricuspid regurgitant velocity is 2.63 m/s, and with an assumed right atrial pressure  of 8 mmHg, the estimated right ventricular systolic pressure is mildly elevated at 35.7 mmHg. Left Atrium: Left atrial size was normal in size. Right Atrium: Right  atrial size was mild-moderately dilated Pericardium: There is no evidence of pericardial effusion. Mitral Valve: The mitral valve is  normal in structure. Trivial mitral valve regurgitation. No evidence of mitral valve stenosis by observation. MV peak gradient, 4.9 mmHg. Tricuspid Valve: The tricuspid valve is normal in structure. Tricuspid valve regurgitation is mild-moderate. Aortic Valve: The aortic valve has an indeterminant number of cusps. Aortic valve regurgitation is trivial. Mild to moderate aortic valve sclerosis/calcification is present, without any evidence of aortic stenosis. Aortic valve mean gradient measures 3.0  mmHg. Aortic valve peak gradient measures 7.0 mmHg. Pulmonic Valve: The pulmonic valve was normal in structure. Pulmonic valve regurgitation is not visualized. Pulmonic regurgitation is not visualized. Aorta: The aortic root, ascending aorta and aortic arch are all structurally normal, with no evidence of dilitation or obstruction. Venous: The inferior vena cava is normal in size with less than 50% respiratory variability, suggesting right atrial pressure of 8 mmHg. IAS/Shunts: No atrial level shunt detected by color flow Doppler. There is no evidence of a patent foramen ovale. No ventricular septal defect is seen or detected. There is no evidence of an atrial septal defect.   Diastology LV e' lateral:   5.15 cm/s LV E/e' lateral: 12.6 LV e' medial:    2.61 cm/s LV E/e' medial:  24.9  RIGHT VENTRICLE             IVC RV Basal diam:  2.80 cm     IVC diam: 1.60 cm RV S prime:     13.10 cm/s TAPSE (M-mode): 2.2 cm LEFT ATRIUM             Index       RIGHT ATRIUM           Index LA Vol (A2C):   35.4 ml 25.70 ml/m RA Area:     20.50 cm LA Vol (A4C):   39.4 ml 28.61 ml/m RA Volume:   65.70 ml  47.71 ml/m LA Biplane Vol: 38.3 ml 27.81 ml/m  AORTIC VALVE AV Vmax:           132.00 cm/s AV Vmean:          85.000 cm/s AV VTI:            0.291 m AV Peak Grad:      7.0 mmHg AV Mean Grad:      3.0 mmHg LVOT Vmax:         107.00 cm/s LVOT Vmean:        63.600 cm/s LVOT VTI:          0.212 m LVOT/AV VTI ratio: 0.73 MITRAL VALVE                         TRICUSPID VALVE MV Area (PHT): 2.66 cm             TR Peak grad:   27.7 mmHg MV Peak grad:  4.9 mmHg             TR Vmax:        281.00 cm/s MV Mean grad:  1.0 mmHg MV Vmax:       1.11 m/s             SHUNTS MV Vmean:      49.3 cm/s            Systemic VTI: 0.21 m MV VTI:        0.38 m MV PHT:  82.65 msec MV Decel Time: 285 msec MV E velocity: 65.10 cm/s 103 cm/s MV A velocity: 93.40 cm/s 70.3 cm/s MV E/A ratio:  0.70       1.5  Armanda Magic MD Electronically signed by Armanda Magic MD Signature Date/Time: 04/29/2019/11:22:16 AM    Final     Subjective: lethargic  Discharge Exam: Vitals:   05/03/19 2359 05/04/19 0744  BP: 128/86 (!) 136/106  Pulse: 80 86  Resp: 19 17  Temp: 98.2 F (36.8 C) 98.3 F (36.8 C)  SpO2: 98% 100%     General: Pt is alert, awake, not in acute distress Cardiovascular: RRR, S1/S2 +, no rubs, no gallops Respiratory: CTA bilaterally, no wheezing, no rhonchi Abdominal: Soft, NT, ND, bowel sounds + Extremities: no edema, no cyanosis    The results of significant diagnostics from this hospitalization (including imaging, microbiology, ancillary and laboratory) are listed below for reference.     Microbiology: Recent Results (from the past 240 hour(s))  Urine culture     Status: Abnormal   Collection Time: 04/28/19  2:05 AM   Specimen: Urine, Catheterized  Result Value Ref Range Status   Specimen Description URINE, CATHETERIZED  Final   Special Requests   Final    NONE Performed at [facility], [address] [place], [place] [address]    Culture MULTIPLE SPECIES PRESENT, SUGGEST RECOLLECTION (A)  Final   Report Status 04/29/2019 FINAL  Final  SARS CORONAVIRUS 2 (TAT 6-24 HRS) Nasopharyngeal Nasopharyngeal Swab     Status: None   Collection Time: 04/28/19  6:32 AM   Specimen: Nasopharyngeal Swab  Result Value Ref Range Status   SARS Coronavirus 2 NEGATIVE NEGATIVE Final    Comment: (NOTE) SARS-CoV-2 target nucleic acids are  NOT DETECTED. The SARS-CoV-2 RNA is generally detectable in upper and lower respiratory specimens during the acute phase of infection. Negative results do not preclude SARS-CoV-2 infection, do not rule out co-infections with other pathogens, and should not be used as the sole basis for treatment or other patient management decisions. Negative results must be combined with clinical observations, patient history, and epidemiological information. The expected result is Negative. Fact Sheet for Patients: HairSlick.no Fact Sheet for Healthcare Providers: quierodirigir.com This test is not yet approved or cleared by the [place] FDA and  has been authorized for detection and/or diagnosis of SARS-CoV-2 by FDA under an Emergency Use Authorization (EUA). This EUA will remain  in effect (meaning this test can be used) for the duration of the COVID-19 declaration under Section 56 4(b)(1) of the Act, 21 U.S.C. section 360bbb-3(b)(1), unless the authorization is terminated or revoked sooner. Performed at [facility], [address] [place], [place] [address]      Labs: BNP (last 3 results) No results for input(s): BNP in the last 8760 hours. Basic Metabolic Panel: Recent Labs  Lab 04/28/19 0212 04/28/19 1140 04/29/19 0449  NA 144  --  139  K 3.0*  --  3.5  CL 115*  --  105  CO2 18*  --  24  GLUCOSE 110*  --  85  BUN 13  --  8  CREATININE 0.76  --  0.79  CALCIUM 7.7*  --  9.0  MG  --  1.5*  --    Liver Function Tests: Recent Labs  Lab 04/28/19 0212  AST 15  ALT 9  ALKPHOS 35*  BILITOT 0.7  PROT 5.6*  ALBUMIN 3.1*   No results for input(s): LIPASE,  AMYLASE in the last 168 hours. No results for input(s): AMMONIA in the last 168 hours. CBC: Recent Labs  Lab 04/28/19 0212 04/29/19 0449  WBC 4.2 5.6  HGB 10.4* 11.0*  HCT 30.9* 31.9*  MCV 84.9 82.2  PLT 94* 91*   Cardiac Enzymes: No results for  input(s): CKTOTAL, CKMB, CKMBINDEX, TROPONINI in the last 168 hours. BNP: Invalid input(s): POCBNP CBG: No results for input(s): GLUCAP in the last 168 hours. D-Dimer No results for input(s): DDIMER in the last 72 hours. Hgb A1c No results for input(s): HGBA1C in the last 72 hours. Lipid Profile No results for input(s): CHOL, HDL, LDLCALC, TRIG, CHOLHDL, LDLDIRECT in the last 72 hours. Thyroid function studies No results for input(s): TSH, T4TOTAL, T3FREE, THYROIDAB in the last 72 hours.  Invalid input(s): FREET3 Anemia work up No results for input(s): VITAMINB12, FOLATE, FERRITIN, TIBC, IRON, RETICCTPCT in the last 72 hours. Urinalysis    Component Value Date/Time   COLORURINE YELLOW 04/28/2019 0212   APPEARANCEUR HAZY (A) 04/28/2019 0212   LABSPEC 1.014 04/28/2019 0212   PHURINE 6.0 04/28/2019 0212   GLUCOSEU NEGATIVE 04/28/2019 0212   HGBUR SMALL (A) 04/28/2019 0212   BILIRUBINUR NEGATIVE 04/28/2019 0212   KETONESUR 20 (A) 04/28/2019 0212   PROTEINUR NEGATIVE 04/28/2019 0212   UROBILINOGEN 0.2 10/17/2014 2304   NITRITE NEGATIVE 04/28/2019 0212   LEUKOCYTESUR TRACE (A) 04/28/2019 0212   Sepsis Labs Invalid input(s): PROCALCITONIN,  WBC,  LACTICIDVEN Microbiology Recent Results (from the past 240 hour(s))  Urine culture     Status: Abnormal   Collection Time: 04/28/19  2:05 AM   Specimen: Urine, Catheterized  Result Value Ref Range Status   Specimen Description URINE, CATHETERIZED  Final   Special Requests   Final    NONE Performed at [facility], [address] [place], [place] [address]    Culture MULTIPLE SPECIES PRESENT, SUGGEST RECOLLECTION (A)  Final   Report Status 04/29/2019 FINAL  Final  SARS CORONAVIRUS 2 (TAT 6-24 HRS) Nasopharyngeal Nasopharyngeal Swab     Status: None   Collection Time: 04/28/19  6:32 AM   Specimen: Nasopharyngeal Swab  Result Value Ref Range Status   SARS Coronavirus 2 NEGATIVE NEGATIVE Final    Comment: (NOTE) SARS-CoV-2  target nucleic acids are NOT DETECTED. The SARS-CoV-2 RNA is generally detectable in upper and lower respiratory specimens during the acute phase of infection. Negative results do not preclude SARS-CoV-2 infection, do not rule out co-infections with other pathogens, and should not be used as the sole basis for treatment or other patient management decisions. Negative results must be combined with clinical observations, patient history, and epidemiological information. The expected result is Negative. Fact Sheet for Patients: HairSlick.nohttps://www.fda.gov/media/138098/download Fact Sheet for Healthcare Providers: quierodirigir.comhttps://www.fda.gov/media/138095/download This test is not yet approved or cleared by the [place] FDA and  has been authorized for detection and/or diagnosis of SARS-CoV-2 by FDA under an Emergency Use Authorization (EUA). This EUA will remain  in effect (meaning this test can be used) for the duration of the COVID-19 declaration under Section 56 4(b)(1) of the Act, 21 U.S.C. section 360bbb-3(b)(1), unless the authorization is terminated or revoked sooner. Performed at [facility], [address] [place], [place] [address]      Time coordinating discharge: 40 minutes  SIGNED:   Alba CoryBelkys A Iren Whipp, MD  [facility]

## 2019-05-04 NOTE — Progress Notes (Signed)
Civil engineer, contracting [facility])  [facility] has a bed for Ms. [name] today.  Left message for son [name].  Will update TOC manager once consents are completed, then pt can transfer.  Wallis Bamberg RN, BSN, CCRN [facility] Liaison  [phone]

## 2019-05-04 NOTE — TOC Progression Note (Signed)
Transition of Care [facility]) - Progression Note    Patient Details  Name: [name] MRN: [id number] Date of Birth: 06-Mar-1930  Transition of [facility]) CM/SW Contact  Baldemar [place], [place] Phone Number: 05/04/2019, 3:49 PM  Clinical Narrative:   CSW alerted by [facility] that they had a bed for the patient but were unable to get in touch with the patient's son, [name]. CSW attempted to reach Shawn and left a voicemail for him to call back. CSW then received update from [facility] that the son was refusing to have her transported today. CSW asked MD to call him about her being stable for discharge, and the family has concerns about what they will do if Medicare doesn't cover her hospice admission and need time to make the decision. CSW later alerted by MD that she is concerned whether the patient is stable today, wanting to put discharge on hold at this time. [facility] will still have a bed for patient tomorrow if the sons are in agreement and if the patient remains stable. CSW to follow.    Expected Discharge Plan: Hospice Medical Facility Barriers to Discharge: Continued Medical Work up  Expected Discharge Plan and Services Expected Discharge Plan: Hospice Medical Facility     Post Acute Care Choice: Hospice Living arrangements for the past 2 months: Single Family Home Expected Discharge Date: 05/04/19                                     Social Determinants of Health (SDOH) Interventions    Readmission Risk Interventions No flowsheet data found.

## 2019-05-05 NOTE — Progress Notes (Addendum)
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  After speaking with his brothers again, he is in agreement for his mother to be transitioned to [facility].  Consents explained and sent via email per his request.  Once they are returned, pt can be moved to [facility].  Kimberly Bamberg RN, BSN, CCRN [facility] Liaison (in [place]) [phone]  **Consents completed, [facility] is ready for [name], updated [facility] manager that transport can be arranged at this time.  RN staff, please call report to [phone], bed assigned at this time.  Please fax d/c summary to [phone].

## 2019-05-05 NOTE — Progress Notes (Signed)
Pt discharge education and care plan completed. Pt IV removed; report called to nurse Noralyn Pick at [facility]. Pt waiting on [facility] to come transport off to disposition. Dionne Bucy RN

## 2019-05-05 NOTE — TOC Transition Note (Signed)
Transition of Care [facility]) - CM/SW Discharge Note   Patient Details  Name: [name] MRN: [id number] Date of Birth: 03/04/1930  Transition of Care [facility]) CM/SW Contact:  Gildardo Griffes, LCSW Phone Number: 05/05/2019, 3:08 PM   Clinical Narrative:     Patient will DC to: [facility] Anticipated DC date: 05/05/19 Family notified:Shawn Transport QG:BEEF  Per MD patient ready for DC to [facility] . RN, patient, patient's family, and facility notified of DC. Discharge Summary sent to facility. RN given number for report [phone]. DC packet on chart. Ambulance transport requested for patient.  CSW signing off.  [place], [place] [phone]   Final next level of care: Hospice Medical Facility Barriers to Discharge: No Barriers Identified   Patient Goals and CMS Choice Patient states their goals for this hospitalization and ongoing recovery are:: patient unable to state goals due to decreased responsiveness CMS Medicare.gov Compare Post Acute Care list provided to:: Patient Represenative (must comment)(son) Choice offered to / list presented to : Adult Children  Discharge Placement              Patient chooses bed at: Other - please specify in the comment section below:([facility]) Patient to be transferred to facility by: [facility] Name of family member notified: son Shawn Patient and family notified of of transfer: 05/05/19  Discharge Plan and Services     Post Acute Care Choice: Hospice                               Social Determinants of Health (SDOH) Interventions     Readmission Risk Interventions No flowsheet data found.

## 2019-05-05 NOTE — Care Management (Signed)
11:30 Spoke w patient's son [name] (after leaving voicemail and having to get his brother Arlys John to call him to get him to answer phone).  He states he still has questions about hospice, and needs to talk to his brothers. I acknowledged the importance of him getting his questions answered. I told him I would have [facility] call him, but he needed to commit to answering the phone. He stated he would now have his phone on him and answer. Victorino Dike to follow up w family to hopefully place in [place] today.

## 2019-05-05 NOTE — Progress Notes (Signed)
Civil engineer, contracting [facility])  [facility] has a bed for Mrs. Douglass today.  Unable to get in touch with son again today.  He declined a bed yesterday as he did not want to incur any possible cost, and requested a call back today for further discussion.  Will keep TOC manager updated should we be able to reach the son.  Wallis Bamberg RN, BSN, CCRN [facility] Liaison (in [place]) [phone]

## 2019-05-05 NOTE — Progress Notes (Signed)
Pt picked up by [facility] to be transported off to disposition. P. Amo Dejuan Elman RN 

## 2019-05-21 DEATH — deceased
